# Patient Record
Sex: Male | Born: 1970 | Race: Black or African American | Hispanic: No | Marital: Single | Smoking: Former smoker
Health system: Southern US, Community
[De-identification: ages and names within clinical notes are randomized; demographics above are authoritative.]

## PROBLEM LIST (undated history)

## (undated) HISTORY — PX: TONSILLECTOMY: SUR1361

## (undated) HISTORY — PX: THORACIC FUSION: SHX1062

## (undated) HISTORY — PX: SPINAL FUSION: SHX223

---

## 2007-12-16 ENCOUNTER — Ambulatory Visit: Payer: Self-pay | Admitting: Internal Medicine

## 2008-08-28 ENCOUNTER — Ambulatory Visit: Payer: Self-pay | Admitting: Internal Medicine

## 2008-09-01 ENCOUNTER — Ambulatory Visit: Payer: Self-pay | Admitting: Family Medicine

## 2009-04-20 ENCOUNTER — Ambulatory Visit: Payer: Self-pay | Admitting: Family Medicine

## 2009-06-13 ENCOUNTER — Emergency Department: Payer: Self-pay | Admitting: Emergency Medicine

## 2010-07-24 ENCOUNTER — Ambulatory Visit: Payer: Self-pay | Admitting: Internal Medicine

## 2013-04-22 ENCOUNTER — Encounter: Payer: Self-pay | Admitting: Gastroenterology

## 2013-05-27 ENCOUNTER — Encounter: Payer: Self-pay | Admitting: Gastroenterology

## 2013-05-27 ENCOUNTER — Ambulatory Visit (INDEPENDENT_AMBULATORY_CARE_PROVIDER_SITE_OTHER): Payer: Medicare PPO | Admitting: Gastroenterology

## 2013-05-27 VITALS — BP 122/78 | HR 88 | Ht 71.0 in | Wt 203.4 lb

## 2013-05-27 DIAGNOSIS — K625 Hemorrhage of anus and rectum: Secondary | ICD-10-CM

## 2013-05-27 MED ORDER — NA SULFATE-K SULFATE-MG SULF 17.5-3.13-1.6 GM/177ML PO SOLN: 1.0000 | Freq: Once | ORAL | Status: DC

## 2013-05-27 NOTE — Patient Instructions (Addendum)

## 2013-05-27 NOTE — Progress Notes (Signed)
History of Present Illness: Pleasant 42 year old Afro-American male referred for evaluation of rectal bleeding. Approximately 5 weeks ago he developed constipation. He normally has a bowel movement at least twice a day.  He went 3 days without a BM and was complaining of lower abdominal discomfort. Bowels finally moved spontaneously. Several days later he was placed on antibiotics for a sinus infection. Stools normalized. He susequently developed bright red blood per rectum consisting of blood mixed with his stools. This occurred over several days. In the past he's had minimal blood in the total tissue which he has attributed to hemorrhoids. He's had no further episodes of abdominal pain or constipation.  In June, 2014 hemoglobin was 15 and white count 19.2    History reviewed. No pertinent past medical history. Past Surgical History  Procedure Laterality Date  . Spinal fusion     family history includes Colon polyps in his father; Diabetes in his maternal aunt; Heart disease in his father; Kidney disease in his father; and Prostate cancer in his paternal uncle. Current Outpatient Prescriptions  Medication Sig Dispense Refill  . Ascorbic Acid (VITAMIN C) 1000 MG tablet Take 1,000 mg by mouth daily.      . cholecalciferol (VITAMIN D) 400 UNITS TABS Take 400 Units by mouth daily.      . vitamin B-12 (CYANOCOBALAMIN) 1000 MCG tablet Take 1,000 mcg by mouth daily.      Marland Kitchen zinc gluconate 50 MG tablet Take 50 mg by mouth daily.       No current facility-administered medications for this visit.   Allergies as of 05/27/2013 - Review Complete 05/27/2013  Allergen Reaction Noted  . Erythromycin Nausea And Vomiting 05/27/2013    reports that he has been smoking.  He has never used smokeless tobacco. He reports that he does not drink alcohol or use illicit drugs.     Review of Systems: Pertinent positive and negative review of systems were noted in the above HPI section. All other review of systems  were otherwise negative.  Vital signs were reviewed in today's medical record Physical Exam: General: Well developed , well nourished, no acute distress Skin: anicteric Head: Normocephalic and atraumatic Eyes:  sclerae anicteric, EOMI Ears: Normal auditory acuity Mouth: No deformity or lesions Neck: Supple, no masses or thyromegaly Lungs: Clear throughout to auscultation Heart: Regular rate and rhythm; no murmurs, rubs or bruits Abdomen: Soft, non tender and non distended. No masses, hepatosplenomegaly or hernias noted. Normal Bowel sounds Rectal:deferred Musculoskeletal: Symmetrical with no gross deformities  Skin: No lesions on visible extremities Pulses:  Normal pulses noted Extremities: No clubbing, cyanosis, edema or deformities noted Neurological: Alert oriented x 4, grossly nonfocal Cervical Nodes:  No significant cervical adenopathy Inguinal Nodes: No significant inguinal adenopathy Psychological:  Alert and cooperative. Normal mood and affect

## 2013-05-27 NOTE — Assessment & Plan Note (Signed)
Patient had several days of limited rectal bleeding not characteristic of his hemorrhoidal bleeding, and recent constipation. While hemorrhoidal bleeding certainly is a possibility, a more proximal colonic bleeding source should be ruled out.  Medications #1 colonoscopy

## 2013-06-01 ENCOUNTER — Encounter: Payer: Self-pay | Admitting: Gastroenterology

## 2013-07-03 ENCOUNTER — Encounter: Payer: Self-pay | Admitting: Gastroenterology

## 2013-07-03 ENCOUNTER — Ambulatory Visit (AMBULATORY_SURGERY_CENTER): Payer: Medicare PPO | Admitting: Gastroenterology

## 2013-07-03 VITALS — BP 161/104 | HR 72 | Temp 98.1°F | Resp 17 | Ht 71.0 in | Wt 203.0 lb

## 2013-07-03 DIAGNOSIS — K625 Hemorrhage of anus and rectum: Secondary | ICD-10-CM

## 2013-07-03 DIAGNOSIS — D126 Benign neoplasm of colon, unspecified: Secondary | ICD-10-CM

## 2013-07-03 DIAGNOSIS — K5289 Other specified noninfective gastroenteritis and colitis: Secondary | ICD-10-CM

## 2013-07-03 DIAGNOSIS — K5730 Diverticulosis of large intestine without perforation or abscess without bleeding: Secondary | ICD-10-CM

## 2013-07-03 MED ORDER — SODIUM CHLORIDE 0.9 % IV SOLN: 500.0000 mL | INTRAVENOUS | Status: DC

## 2013-07-03 NOTE — Progress Notes (Signed)
Called to room to assist during endoscopic procedure.  Patient ID and intended procedure confirmed with present staff. Received instructions for my participation in the procedure from the performing physician.  

## 2013-07-03 NOTE — Op Note (Addendum)
[facility] [address] [place], [address]   COLONOSCOPY PROCEDURE REPORT  PATIENT: Tony Gillespie, Tony Gillespie  MR#: [id number] BIRTHDATE: [birth year] , 42  yrs. old GENDER: Male ENDOSCOPIST: Louis Meckel, MD REFERRED BY: PROCEDURE DATE:  07/03/2013 PROCEDURE:   Colonoscopy with snare polypectomy ASA CLASS:   Class II INDICATIONS:Rectal Bleeding. MEDICATIONS: MAC sedation, administered by CRNA and Propofol (Diprivan) 550 mg IV  DESCRIPTION OF PROCEDURE:   After the risks benefits and alternatives of the procedure were thoroughly explained, informed consent was obtained.  A digital rectal exam revealed no abnormalities of the rectum.   The LB [id number]  endoscope was introduced through the anus and advanced to the cecum, which was identified by both the appendix and ileocecal valve. No adverse events experienced.   The quality of the prep was Suprep good  The instrument was then slowly withdrawn as the colon was fully examined.    COLON FINDINGS: A semi-pedunculated polyp with a friable surface was found in the proximal sigmoid colon.  A polypectomy was performed using snare cautery.  The resection was complete and the polyp tissue was completely retrieved.   Beginning in the proximal sigmoid extending approximately 10 cm was an area of edematous mucosa with submucosal hemorrhage in conjunction with multiple diverticula.  There was marked spasm and luminal narrowing.  The scope traversed this area with moderate resistance.   Beginning in the proximal sigmoid extending approximately 10 cm was an area of edematous mucosa with submucosal hemorrhage in conjunction with multiple diverticula.  There was marked spasm and luminal narrowing.  The scope traversed this area with moderate resistance. Internal hemorrhoids were found.   Moderate diverticulosis was noted in the descending colon, transverse colon, ascending colon, and at the cecum.  Retroflexed views  revealed no abnormalities. The time to cecum=7 minutes 43 seconds.  Withdrawal time=14 minutes 11 seconds.  The scope was withdrawn and the procedure completed. COMPLICATIONS: There were no complications.  ENDOSCOPIC IMPRESSION: 1.   colon polyp 2.  segmental colitis-probable resolving diverticulitis 3.  internal hemorrhoids - probable source for bleeding 4.  diverticulosis  RECOMMENDATIONS: If the polyp(s) removed today are proven to be adenomatous (pre-cancerous) polyps, you will need a repeat colonoscopy in 5 years.  Otherwise you should continue to follow colorectal cancer screening guidelines for "routine risk" patients with colonoscopy in 10 years.  You will receive a letter within 1-2 weeks with the results of your biopsy as well as final recommendations.  Please call my office if you have not received a letter after 3 weeks.   eSigned:  Louis Meckel, MD 07/03/2013 3:39 PM Revised: 07/03/2013 3:39 PM  cc:   PATIENT NAME:  Tony Gillespie, Tony Gillespie MR#: [id number]

## 2013-07-03 NOTE — Progress Notes (Signed)
Procedure ends, to recovery, report given and VSS. 

## 2013-07-03 NOTE — Patient Instructions (Addendum)
YOU HAD AN ENDOSCOPIC PROCEDURE TODAY AT THE [facility]: Refer to the procedure report that was given to you for any specific questions about what was found during the examination.  If the procedure report does not answer your questions, please call your gastroenterologist to clarify.  If you requested that your care partner not be given the details of your procedure findings, then the procedure report has been included in a sealed envelope for you to review at your convenience later.  YOU SHOULD EXPECT: Some feelings of bloating in the abdomen. Passage of more gas than usual.  Walking can help get rid of the air that was put into your GI tract during the procedure and reduce the bloating. If you had a lower endoscopy (such as a colonoscopy or flexible sigmoidoscopy) you may notice spotting of blood in your stool or on the toilet paper. If you underwent a bowel prep for your procedure, then you may not have a normal bowel movement for a few days.  DIET: Your first meal following the procedure should be a light meal and then it is ok to progress to your normal diet.  A half-sandwich or bowl of soup is an example of a good first meal.  Heavy or fried foods are harder to digest and may make you feel nauseous or bloated.  Likewise meals heavy in dairy and vegetables can cause extra gas to form and this can also increase the bloating.  Drink plenty of fluids but you should avoid alcoholic beverages for 24 hours.  ACTIVITY: Your care partner should take you home directly after the procedure.  You should plan to take it easy, moving slowly for the rest of the day.  You can resume normal activity the day after the procedure however you should NOT DRIVE or use heavy machinery for 24 hours (because of the sedation medicines used during the test).    SYMPTOMS TO REPORT IMMEDIATELY: A gastroenterologist can be reached at any hour.  During normal business hours, 8:30 AM to 5:00 PM Monday through Friday,  call [phone].  After hours and on weekends, please call the GI answering service at [phone] who will take a message and have the physician on call contact you.   Following lower endoscopy (colonoscopy or flexible sigmoidoscopy):  Excessive amounts of blood in the stool  Significant tenderness or worsening of abdominal pains  Swelling of the abdomen that is new, acute  Fever of 100F or higher  FOLLOW UP: If any biopsies were taken you will be contacted by phone or by letter within the next 1-3 weeks.  Call your gastroenterologist if you have not heard about the biopsies in 3 weeks.  Our staff will call the home number listed on your records the next business day following your procedure to check on you and address any questions or concerns that you may have at that time regarding the information given to you following your procedure. This is a courtesy call and so if there is no answer at the home number and we have not heard from you through the emergency physician on call, we will assume that you have returned to your regular daily activities without incident.  SIGNATURES/CONFIDENTIALITY: You and/or your care partner have signed paperwork which will be entered into your electronic medical record.  These signatures attest to the fact that that the information above on your After Visit Summary has been reviewed and is understood.  Full responsibility of the confidentiality of this   discharge information lies with you and/or your care-partner.  Recommendations See procedure report  

## 2013-07-03 NOTE — Progress Notes (Signed)
Patient did not have preoperative order for IV antibiotic SSI prophylaxis. (G8918)  Patient did not experience any of the following events: a burn prior to discharge; a fall within the facility; wrong site/side/patient/procedure/implant event; or a hospital transfer or hospital admission upon discharge from the facility. (G8907)  

## 2013-07-06 ENCOUNTER — Telehealth: Payer: Self-pay | Admitting: *Deleted

## 2013-07-06 ENCOUNTER — Telehealth: Payer: Self-pay | Admitting: Gastroenterology

## 2013-07-06 NOTE — Telephone Encounter (Signed)
  Follow up Call-  Call back number 07/03/2013  Post procedure Call Back phone  # [phone]  Permission to leave phone message Yes    [facility]

## 2013-07-06 NOTE — Telephone Encounter (Signed)
Pt states he is having some discomfort to his LLQ, above his groin- rating as a "1.5"  Eating and having bowel movements.  He states this discomfort has gotten better over the weekend.  He states his abd is soft and palpable.  I told him to call back if pain increases- possibly could be from applying abdominal pressure.  Understanding voiced

## 2013-07-06 NOTE — Telephone Encounter (Signed)
LMOM to call back

## 2013-07-08 ENCOUNTER — Encounter: Payer: Self-pay | Admitting: Gastroenterology

## 2015-09-06 ENCOUNTER — Ambulatory Visit (INDEPENDENT_AMBULATORY_CARE_PROVIDER_SITE_OTHER): Payer: BLUE CROSS/BLUE SHIELD

## 2015-09-06 ENCOUNTER — Ambulatory Visit
Admission: EM | Admit: 2015-09-06 | Discharge: 2015-09-06 | Disposition: A | Discharge status: Home / Self Care | Payer: BLUE CROSS/BLUE SHIELD | Attending: Family Medicine | Admitting: Family Medicine

## 2015-09-06 DIAGNOSIS — J309 Allergic rhinitis, unspecified: Secondary | ICD-10-CM

## 2015-09-06 DIAGNOSIS — H6593 Unspecified nonsuppurative otitis media, bilateral: Secondary | ICD-10-CM | POA: Diagnosis not present

## 2015-09-06 DIAGNOSIS — J209 Acute bronchitis, unspecified: Secondary | ICD-10-CM

## 2015-09-06 MED ORDER — IPRATROPIUM-ALBUTEROL 0.5-2.5 (3) MG/3ML IN SOLN
3.0000 mL | Freq: Once | RESPIRATORY_TRACT | Status: AC
  Administered 2015-09-06: 3 mL via RESPIRATORY_TRACT

## 2015-09-06 MED ORDER — BENZONATATE 200 MG PO CAPS: 200.0000 mg | ORAL_CAPSULE | Freq: Three times a day (TID) | ORAL | Status: DC | PRN

## 2015-09-06 MED ORDER — AZITHROMYCIN 250 MG PO TABS: 250.0000 mg | ORAL_TABLET | Freq: Every day | ORAL | Status: DC

## 2015-09-06 MED ORDER — SALINE SPRAY 0.65 % NA SOLN: 2.0000 | NASAL | Status: DC

## 2015-09-06 MED ORDER — PREDNISONE 50 MG PO TABS
ORAL_TABLET | ORAL | Status: AC
Start: 2015-09-06 — End: 2015-09-11

## 2015-09-06 MED ORDER — FLUTICASONE PROPIONATE 50 MCG/ACT NA SUSP: 1.0000 | Freq: Every day | NASAL | Status: DC

## 2015-09-06 NOTE — ED Provider Notes (Signed)
CSN: [id number]     Arrival date & time 09/06/15  1259 History   First MD Initiated Contact with Patient 09/06/15 1346     Chief Complaint  Patient presents with  . URI   (Consider location/radiation/quality/duration/timing/severity/associated sxs/prior Treatment) HPI Comments: Married caucasian male here for evaluation of cough x 4 weeks rare clear sputum.  Smoker on/off for 40 years.  Has quit and restarted multiple times.  Currently 1 pack cigarettes per week.  Worried about air quality at work old building in [place] allergies are flaring down.  Having sweats at night and cough waking him up.  Takes claritin 10mg  po prn if congestion/post nasal drip bad  Sometimes gagging with current cough.  Has taken azithromycin without side effects in the past not allergic to it only erythromycin.  The history is provided by the patient.    History reviewed. No pertinent past medical history. Past Surgical History  Procedure Laterality Date  . Spinal fusion    . Tonsillectomy    . Thoracic fusion     Family History  Problem Relation Age of Onset  . Colon polyps Father   . Heart disease Father   . Kidney disease Father   . Diabetes Maternal Aunt   . Prostate cancer Paternal Uncle    Social History  Substance Use Topics  . Smoking status: Current Every Day Smoker -- 0.50 packs/day  . Smokeless tobacco: Never Used  . Alcohol Use: No    Review of Systems  Constitutional: Positive for diaphoresis. Negative for fever, chills, activity change, appetite change, fatigue and unexpected weight change.  HENT: Positive for congestion and postnasal drip. Negative for dental problem, drooling, ear discharge, ear pain, facial swelling, hearing loss, mouth sores, nosebleeds, rhinorrhea, sinus pressure, sneezing, sore throat, tinnitus, trouble swallowing and voice change.   Eyes: Negative for photophobia, pain, discharge, redness, itching and visual disturbance.  Respiratory: Positive for cough.  Negative for choking, chest tightness, shortness of breath, wheezing and stridor.   Cardiovascular: Negative for chest pain, palpitations and leg swelling.  Gastrointestinal: Negative for nausea, vomiting, abdominal pain, diarrhea, constipation, blood in stool and abdominal distention.  Endocrine: Negative for cold intolerance and heat intolerance.  Genitourinary: Negative for dysuria.  Musculoskeletal: Negative for myalgias, back pain, joint swelling, arthralgias, gait problem, neck pain and neck stiffness.  Skin: Negative for color change, pallor, rash and wound.  Allergic/Immunologic: Positive for environmental allergies. Negative for food allergies and immunocompromised state.  Neurological: Negative for dizziness, tremors, seizures, syncope, facial asymmetry, speech difficulty, weakness, light-headedness, numbness and headaches.  Hematological: Negative for adenopathy. Does not bruise/bleed easily.  Psychiatric/Behavioral: Negative for behavioral problems, confusion, sleep disturbance and agitation.    Allergies  Erythromycin  Home Medications   Prior to Admission medications   Medication Sig Start Date End Date Taking? Authorizing Provider  Ascorbic Acid (VITAMIN C) 1000 MG tablet Take 1,000 mg by mouth daily.    Historical Provider, MD  azithromycin (ZITHROMAX) 250 MG tablet Take 1 tablet (250 mg total) by mouth daily. Take first 2 tablets together, then 1 every day until finished. 09/06/15   Olen Cordial, NP  benzonatate (TESSALON) 200 MG capsule Take 1 capsule (200 mg total) by mouth 3 (three) times daily as needed. 09/06/15   Olen Cordial, NP  cholecalciferol (VITAMIN D) 400 UNITS TABS Take 400 Units by mouth daily.    Historical Provider, MD  fluticasone (FLONASE) 50 MCG/ACT nasal spray Place 1 spray into both nostrils daily. 09/06/15  Olen Cordial, NP  predniSONE (DELTASONE) 50 MG tablet Take 1 tab by mouth with breakfast daily x 5 days 09/06/15 09/11/15  Olen Cordial, NP  sodium chloride (OCEAN) 0.65 % SOLN nasal spray Place 2 sprays into both nostrils every 2 (two) hours while awake. 09/06/15   Olen Cordial, NP  vitamin B-12 (CYANOCOBALAMIN) 1000 MCG tablet Take 1,000 mcg by mouth daily.    Historical Provider, MD  zinc gluconate 50 MG tablet Take 50 mg by mouth daily.    Historical Provider, MD   Meds Ordered and Administered this Visit   Medications  ipratropium-albuterol (DUONEB) 0.5-2.5 (3) MG/3ML nebulizer solution 3 mL (not administered)    BP 126/86 mmHg  Pulse 92  Temp(Src) 97.4 F (36.3 C) (Tympanic)  Resp 17  Ht 5\' 11"  (1.803 m)  Wt 225 lb (102.059 kg)  BMI 31.39 kg/m2  SpO2 98% No data found.   Physical Exam  Constitutional: He is oriented to person, place, and time. He appears well-developed and well-nourished. He is active and cooperative.  Non-toxic appearance. He does not have a sickly appearance. He appears ill. No distress.  HENT:  Head: Normocephalic and atraumatic.  Right Ear: Hearing, external ear and ear canal normal. A middle ear effusion is present.  Left Ear: Hearing, external ear and ear canal normal. A middle ear effusion is present.  Nose: Mucosal edema and rhinorrhea present. No nose lacerations, sinus tenderness, nasal deformity, septal deviation or nasal septal hematoma. No epistaxis.  No foreign bodies. Right sinus exhibits no maxillary sinus tenderness and no frontal sinus tenderness. Left sinus exhibits no maxillary sinus tenderness and no frontal sinus tenderness.  Mouth/Throat: Uvula is midline and mucous membranes are normal. Mucous membranes are not pale, not dry and not cyanotic. He does not have dentures. No oral lesions. No trismus in the jaw. Normal dentition. No dental abscesses, uvula swelling, lacerations or dental caries. Posterior oropharyngeal edema and posterior oropharyngeal erythema present. No oropharyngeal exudate or tonsillar abscesses.  Eyes: Conjunctivae, EOM and lids are  normal. Pupils are equal, round, and reactive to light. Right eye exhibits no chemosis, no discharge, no exudate and no hordeolum. No foreign body present in the right eye. Left eye exhibits no chemosis, no discharge, no exudate and no hordeolum. No foreign body present in the left eye. Right conjunctiva is not injected. Right conjunctiva has no hemorrhage. Left conjunctiva is not injected. Left conjunctiva has no hemorrhage. No scleral icterus. Right eye exhibits normal extraocular motion and no nystagmus. Left eye exhibits normal extraocular motion and no nystagmus. Right pupil is round and reactive. Left pupil is round and reactive. Pupils are equal.  Neck: Trachea normal and normal range of motion. Neck supple. No tracheal tenderness, no spinous process tenderness and no muscular tenderness present. No rigidity. No tracheal deviation, no edema, no erythema and normal range of motion present.  Cardiovascular: Normal rate, regular rhythm, S1 normal, S2 normal, normal heart sounds and intact distal pulses.  PMI is not displaced.  Exam reveals no gallop and no friction rub.   No murmur heard. Pulmonary/Chest: Effort normal and breath sounds normal. No accessory muscle usage or stridor. No respiratory distress. He has no decreased breath sounds. He has no wheezes. He has no rhonchi. He has no rales. He exhibits no tenderness.  Raspy occasional nonproductive cough  During exam  Abdominal: Soft. He exhibits no distension. There is no rigidity and no guarding.  Musculoskeletal: Normal range of motion. He exhibits no  edema or tenderness.       Right shoulder: Normal.       Left shoulder: Normal.       Right elbow: Normal.      Left elbow: Normal.       Right hip: Normal.       Left hip: Normal.       Right knee: Normal.       Left knee: Normal.       Cervical back: Normal.       Thoracic back: Normal.       Lumbar back: Normal.       Right hand: Normal.       Left hand: Normal.  Lymphadenopathy:        Head (right side): No submental, no submandibular, no tonsillar, no preauricular, no posterior auricular and no occipital adenopathy present.       Head (left side): No submental, no submandibular, no tonsillar, no preauricular, no posterior auricular and no occipital adenopathy present.    He has no cervical adenopathy.       Right cervical: No superficial cervical, no deep cervical and no posterior cervical adenopathy present.      Left cervical: No superficial cervical, no deep cervical and no posterior cervical adenopathy present.       Right: No supraclavicular adenopathy present.       Left: No supraclavicular adenopathy present.  Neurological: He is alert and oriented to person, place, and time. He has normal strength. He displays no atrophy and no tremor. No cranial nerve deficit or sensory deficit. He exhibits normal muscle tone. He displays no seizure activity. Coordination and gait normal. GCS eye subscore is 4. GCS verbal subscore is 5. GCS motor subscore is 6.  Skin: Skin is warm, dry and intact. No abrasion, no bruising, no burn, no ecchymosis, no laceration, no petechiae and no rash noted. He is not diaphoretic. No cyanosis or erythema. No pallor. Nails show no clubbing.  Psychiatric: He has a normal mood and affect. His speech is normal and behavior is normal. Judgment and thought content normal. Cognition and memory are normal.  Nursing note and vitals reviewed.   ED Course  Procedures (including critical care time)  Labs Review Labs Reviewed - No data to display  Imaging Review Dg Chest 2 View  09/06/2015  CLINICAL DATA:  44 year old current smoker with 4 week history of nonproductive cough and 1 day history of low grade fever. EXAM: CHEST  2 VIEW COMPARISON:  None. FINDINGS: Cardiomediastinal silhouette unremarkable. Mild central peribronchial thickening. Lungs otherwise clear. No localized airspace consolidation. No pleural effusions. No pneumothorax. Normal pulmonary  vascularity. Prior posterior interspinous fusion with cerclage wires at T12-L1. 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 IMPRESSION: Mild changes of bronchitis and/or asthma which may be acute or chronic. No acute cardiopulmonary disease otherwise. Electronically Signed   By: Evangeline Dakin M.D.   On: 09/06/2015 13:53   1434 re-evaluated after duoneb by RN Marni Griffon administered. sp02 98% on room air post duoneb.  Patient did not get any relief from symptoms or feel like he had improved airflow in chest.  Discussed chest xray results positive for bronchitis possible asthma.  Patient to follow up with [facility] consider pulmonology referral due to history of allergies and smoking.  Start flonase and tessalon today.  Prednisone 50mg  po with breakfast x 5 days starting tomorrow Rx given.  Rx for azithromycin 2  tabs today and one tab daily days 2-5 if no improvement with flonase/prednisone/saline/tessalon pearles.  Patient did not want albuterol inhaler Rx.  Patient verbalized understanding of information/instructions, agreed with plan of care and had no further questions at this time.  MDM   1. Acute bronchitis, unspecified organism   2. Allergic rhinitis, unspecified allergic rhinitis type   3. Otitis media with effusion, bilateral    Work excuse given for today.  Prednisone 50mg  po daily x 5 days and tessalon pearles 200mg  po TID prn.  Honey with lemon, steam showers twice a day.  Post nasal drip probable cause from allergies and/or viral URI prominent in community at this time started bronchitis.  Patient given copy of radiology report.  If no improvement with night time cough honey/lemon, tessalon and prednisone will consider Rx for tussionex/cheratussin.  Discussed with patient I am working Fri-Mon and to contact me via telephone if no improvement in symptoms with plan of care to add narcotic cough medicine.   Bronchitis simple, community acquired, may have started as viral (probably respiratory syncytial, parainfluenza, influenza, or adenovirus), but now evidence of acute purulent bronchitis with resultant bronchial edema and mucus formation.  Differential Diagnoses:  Reactive Airway Disease (asthma, allergic aspergillosis eosinophilia), chronic bronchitis, respiratory infection (sinusitis, common cold, pneumonia), congestive heart failure, smoke/irritant exposure, reflux esophagitis, bronchogenic tumor, and/or aspiration syndromes.  I will give Zithromax for five days for possible Mycoplasma.  Without high fever, severe dyspnea and lack of physical findings or risk factors will hold on chest radiograph and CBC at this time.  I discussed that approximately 50% of patients with acute bronchitis have a cough that lasts up to three weeks, and 25% for over a month. Tylenol, one to two tablets every four hours as needed for fever or myalgias.   No aspirin. Patient instructed to follow up in one week or sooner if symptoms worsen. Patient verbalized agreement and understanding of treatment plan.  P2:  hand washing and cover cough  Continue claritin 10mg  po daily.  Add flonase 1 spray each nostril BID and nasal saline 2 sprays each nostril q2h prn congestion while awake.  Rx given.  Patient may use normal saline nasal spray as needed.  Consider antihistamine or nasal steroid use.  Avoid triggers if possible.  Shower prior to bedtime if exposed to triggers.  If allergic dust/dust mites recommend mattress/pillow covers/encasements; washing linens, vacuuming, sweeping, dusting weekly.  Call or return to clinic as needed if these symptoms worsen or fail to improve as anticipated.   Exitcare handout on allergic rhinitis given to patient.  Patient verbalized understanding of instructions, agreed with plan of care and had no further questions at this time.  P2:  Avoidance and hand washing.  Supportive treatment.   No evidence of  invasive bacterial infection, non toxic and well hydrated.  This is most likely self limiting viral infection.  I do not see where any further testing or imaging is necessary at this time.   I will suggest supportive care, rest, good hygiene and encourage the patient to take adequate fluids.  The patient is to return to clinic or EMERGENCY ROOM if symptoms worsen or change significantly e.g. ear pain, fever, purulent discharge from ears or bleeding.  Exitcare handout on otitis media with effusion given to patient.  Patient verbalized agreement and understanding of treatment plan.     Olen Cordial, NP 09/06/15 1456

## 2015-09-06 NOTE — ED Notes (Signed)
States has had a non productive cough x 3-4 weeks.

## 2015-09-06 NOTE — Discharge Instructions (Signed)
Acute Bronchitis Bronchitis is inflammation of the airways that extend from the windpipe into the lungs (bronchi). The inflammation often causes mucus to develop. This leads to a cough, which is the most common symptom of bronchitis.  In acute bronchitis, the condition usually develops suddenly and goes away over time, usually in a couple weeks. Smoking, allergies, and asthma can make bronchitis worse. Repeated episodes of bronchitis may cause further lung problems.  CAUSES Acute bronchitis is most often caused by the same virus that causes a cold. The virus can spread from person to person (contagious) through coughing, sneezing, and touching contaminated objects. SIGNS AND SYMPTOMS   Cough.   Fever.   Coughing up mucus.   Body aches.   Chest congestion.   Chills.   Shortness of breath.   Sore throat.  DIAGNOSIS  Acute bronchitis is usually diagnosed through a physical exam. Your health care provider will also ask you questions about your medical history. Tests, such as chest X-rays, are sometimes done to rule out other conditions.  TREATMENT  Acute bronchitis usually goes away in a couple weeks. Oftentimes, no medical treatment is necessary. Medicines are sometimes given for relief of fever or cough. Antibiotic medicines are usually not needed but may be prescribed in certain situations. In some cases, an inhaler may be recommended to help reduce shortness of breath and control the cough. A cool mist vaporizer may also be used to help thin bronchial secretions and make it easier to clear the chest.  HOME CARE INSTRUCTIONS  Get plenty of rest.   Drink enough fluids to keep your urine clear or pale yellow (unless you have a medical condition that requires fluid restriction). Increasing fluids may help thin your respiratory secretions (sputum) and reduce chest congestion, and it will prevent dehydration.   Take medicines only as directed by your health care provider.  If  you were prescribed an antibiotic medicine, finish it all even if you start to feel better.  Avoid smoking and secondhand smoke. Exposure to cigarette smoke or irritating chemicals will make bronchitis worse. If you are a smoker, consider using nicotine gum or skin patches to help control withdrawal symptoms. Quitting smoking will help your lungs heal faster.   Reduce the chances of another bout of acute bronchitis by washing your hands frequently, avoiding people with cold symptoms, and trying not to touch your hands to your mouth, nose, or eyes.   Keep all follow-up visits as directed by your health care provider.  SEEK MEDICAL CARE IF: Your symptoms do not improve after 1 week of treatment.  SEEK IMMEDIATE MEDICAL CARE IF:  You develop an increased fever or chills.   You have chest pain.   You have severe shortness of breath.  You have bloody sputum.   You develop dehydration.  You faint or repeatedly feel like you are going to pass out.  You develop repeated vomiting.  You develop a severe headache. MAKE SURE YOU:   Understand these instructions.  Will watch your condition.  Will get help right away if you are not doing well or get worse.   This information is not intended to replace advice given to you by your health care provider. Make sure you discuss any questions you have with your health care provider.   Document Released: 11/22/2004 Document Revised: 11/05/2014 Document Reviewed: 04/07/2013 Elsevier Interactive Patient Education 2016 [employer].  Allergic Rhinitis Allergic rhinitis is when the mucous membranes in the nose respond to allergens. Allergens are particles  in the air that cause your body to have an allergic reaction. This causes you to release allergic antibodies. Through a chain of events, these eventually cause you to release histamine into the blood stream. Although meant to protect the body, it is this release of histamine that causes your  discomfort, such as frequent sneezing, congestion, and an itchy, runny nose.  CAUSES Seasonal allergic rhinitis (hay fever) is caused by pollen allergens that may come from grasses, trees, and weeds. Year-round allergic rhinitis (perennial allergic rhinitis) is caused by allergens such as house dust mites, pet dander, and mold spores. SYMPTOMS  Nasal stuffiness (congestion).  Itchy, runny nose with sneezing and tearing of the eyes. DIAGNOSIS Your health care provider can help you determine the allergen or allergens that trigger your symptoms. If you and your health care provider are unable to determine the allergen, skin or blood testing may be used. Your health care provider will diagnose your condition after taking your health history and performing a physical exam. Your health care provider may assess you for other related conditions, such as asthma, pink eye, or an ear infection. TREATMENT Allergic rhinitis does not have a cure, but it can be controlled by:  Medicines that block allergy symptoms. These may include allergy shots, nasal sprays, and oral antihistamines.  Avoiding the allergen. Hay fever may often be treated with antihistamines in pill or nasal spray forms. Antihistamines block the effects of histamine. There are over-the-counter medicines that may help with nasal congestion and swelling around the eyes. Check with your health care provider before taking or giving this medicine. If avoiding the allergen or the medicine prescribed do not work, there are many new medicines your health care provider can prescribe. Stronger medicine may be used if initial measures are ineffective. Desensitizing injections can be used if medicine and avoidance does not work. Desensitization is when a patient is given ongoing shots until the body becomes less sensitive to the allergen. Make sure you follow up with your health care provider if problems continue. HOME CARE INSTRUCTIONS It is not possible  to completely avoid allergens, but you can reduce your symptoms by taking steps to limit your exposure to them. It helps to know exactly what you are allergic to so that you can avoid your specific triggers. SEEK MEDICAL CARE IF:  You have a fever.  You develop a cough that does not stop easily (persistent).  You have shortness of breath.  You start wheezing.  Symptoms interfere with normal daily activities.   This information is not intended to replace advice given to you by your health care provider. Make sure you discuss any questions you have with your health care provider.   Document Released: 07/10/2001 Document Revised: 11/05/2014 Document Reviewed: 06/22/2013 Elsevier Interactive Patient Education 2016 [employer]. Otitis Media With Effusion Otitis media with effusion is the presence of fluid in the middle ear. This is a common problem in children, which often follows ear infections. It may be present for weeks or longer after the infection. Unlike an acute ear infection, otitis media with effusion refers only to fluid behind the ear drum and not infection. Children with repeated ear and sinus infections and allergy problems are the most likely to get otitis media with effusion. CAUSES  The most frequent cause of the fluid buildup is dysfunction of the eustachian tubes. These are the tubes that drain fluid in the ears to the back of the nose (nasopharynx). SYMPTOMS   The main symptom of  this condition is hearing loss. As a result, you or your child may:  Listen to the TV at a loud volume.  Not respond to questions.  Ask "what" often when spoken to.  Mistake or confuse one sound or word for another.  There may be a sensation of fullness or pressure but usually not pain. DIAGNOSIS   Your health care provider will diagnose this condition by examining you or your child's ears.  Your health care provider may test the pressure in you or your child's ear with a  tympanometer.  A hearing test may be conducted if the problem persists. TREATMENT   Treatment depends on the duration and the effects of the effusion.  Antibiotics, decongestants, nose drops, and cortisone-type drugs (tablets or nasal spray) may not be helpful.  Children with persistent ear effusions may have delayed language or behavioral problems. Children at risk for developmental delays in hearing, learning, and speech may require referral to a specialist earlier than children not at risk.  You or your child's health care provider may suggest a referral to an ear, nose, and throat surgeon for treatment. The following may help restore normal hearing:  Drainage of fluid.  Placement of ear tubes (tympanostomy tubes).  Removal of adenoids (adenoidectomy). HOME CARE INSTRUCTIONS   Avoid secondhand smoke.  Infants who are breastfed are less likely to have this condition.  Avoid feeding infants while they are lying flat.  Avoid known environmental allergens.  Avoid people who are sick. SEEK MEDICAL CARE IF:   Hearing is not better in 3 months.  Hearing is worse.  Ear pain.  Drainage from the ear.  Dizziness. MAKE SURE YOU:   Understand these instructions.  Will watch your condition.  Will get help right away if you are not doing well or get worse.   This information is not intended to replace advice given to you by your health care provider. Make sure you discuss any questions you have with your health care provider.   Document Released: 11/22/2004 Document Revised: 11/05/2014 Document Reviewed: 05/12/2013 Elsevier Interactive Patient Education [employer].

## 2015-12-27 ENCOUNTER — Encounter: Payer: Self-pay | Admitting: Emergency Medicine

## 2015-12-27 ENCOUNTER — Ambulatory Visit (INDEPENDENT_AMBULATORY_CARE_PROVIDER_SITE_OTHER): Payer: BLUE CROSS/BLUE SHIELD

## 2015-12-27 ENCOUNTER — Ambulatory Visit
Admission: EM | Admit: 2015-12-27 | Discharge: 2015-12-27 | Disposition: A | Discharge status: Home / Self Care | Payer: BLUE CROSS/BLUE SHIELD | Attending: Family Medicine | Admitting: Family Medicine

## 2015-12-27 DIAGNOSIS — L03011 Cellulitis of right finger: Secondary | ICD-10-CM

## 2015-12-27 MED ORDER — SULFAMETHOXAZOLE-TRIMETHOPRIM 800-160 MG PO TABS: 1.0000 | ORAL_TABLET | Freq: Two times a day (BID) | ORAL | Status: DC

## 2015-12-27 MED ORDER — TETANUS-DIPHTH-ACELL PERTUSSIS 5-2.5-18.5 LF-MCG/0.5 IM SUSP
0.5000 mL | Freq: Once | INTRAMUSCULAR | Status: AC
  Administered 2015-12-27: 0.5 mL via INTRAMUSCULAR

## 2015-12-27 MED ORDER — HYDROCODONE-ACETAMINOPHEN 5-325 MG PO TABS: 1.0000 | ORAL_TABLET | Freq: Four times a day (QID) | ORAL | Status: DC | PRN

## 2015-12-27 NOTE — ED Notes (Signed)
Patient c/o metal piece in his right second finger for over a week.  Patient denies fevers.  Patient reports swelling, redness, and tenderness in his right 2nd finger.

## 2015-12-27 NOTE — Discharge Instructions (Signed)
Paronychia °Paronychia is an infection of the skin that surrounds a nail. It usually affects the skin around a fingernail, but it may also occur near a toenail. It often causes pain and swelling around the nail. This condition may come on suddenly or develop over a longer period. In some cases, a collection of pus (abscess) can form near or under the nail. Usually, paronychia is not serious and it clears up with treatment. °CAUSES °This condition may be caused by bacteria or fungi. It is commonly caused by either Streptococcus or Staphylococcus bacteria. The bacteria or fungi often cause the infection by getting into the affected area through an opening in the skin, such as a cut or a hangnail. °RISK FACTORS °This condition is more likely to develop in: °· People who get their hands wet often, such as those who work as dishwashers, bartenders, or nurses. °· People who bite their fingernails or suck their thumbs. °· People who trim their nails too short. °· People who have hangnails or injured fingertips. °· People who get manicures. °· People who have diabetes. °SYMPTOMS °Symptoms of this condition include: °· Redness and swelling of the skin near the nail. °· Tenderness around the nail when you touch the area. °· Pus-filled bumps under the cuticle. The cuticle is the skin at the base or sides of the nail. °· Fluid or pus under the nail. °· Throbbing pain in the area. °DIAGNOSIS °This condition is usually diagnosed with a physical exam. In some cases, a sample of pus may be taken from an abscess to be tested in a lab. This can help to determine what type of bacteria or fungi is causing the condition. °TREATMENT °Treatment for this condition depends on the cause and severity of the condition. If the condition is mild, it may clear up on its own in a few days. Your health care provider may recommend soaking the affected area in warm water a few times a day. When treatment is needed, the options may  include: °· Antibiotic medicine, if the condition is caused by a bacterial infection. °· Antifungal medicine, if the condition is caused by a fungal infection. °· Incision and drainage, if an abscess is present. In this procedure, the health care provider will cut open the abscess so the pus can drain out. °HOME CARE INSTRUCTIONS °· Soak the affected area in warm water if directed to do so by your health care provider. You may be told to do this for 20 minutes, 2-3 times a day. Keep the area dry in between soakings. °· Take medicines only as directed by your health care provider. °· If you were prescribed an antibiotic medicine, finish all of it even if you start to feel better. °· Keep the affected area clean. °· Do not try to drain a fluid-filled bump yourself. °· If you will be washing dishes or performing other tasks that require your hands to get wet, wear rubber gloves. You should also wear gloves if your hands might come in contact with irritating substances, such as cleaners or chemicals. °· Follow your health care provider's instructions about: °¨ Wound care. °¨ Bandage (dressing) changes and removal. °SEEK MEDICAL CARE IF: °· Your symptoms get worse or do not improve with treatment. °· You have a fever or chills. °· You have redness spreading from the affected area. °· You have continued or increased fluid, blood, or pus coming from the affected area. °· Your finger or knuckle becomes swollen or is difficult to move. °  °  This information is not intended to replace advice given to you by your health care provider. Make sure you discuss any questions you have with your health care provider.   Document Released: 04/10/2001 Document Revised: 03/01/2015 Document Reviewed: 09/22/2014 Elsevier Interactive Patient Education 2016 [place].   Warm soaks at least twice daily for 20 minutes Apply over the counter topical antibiotic ointment bid Cover up if working in dusty/dirty environment

## 2015-12-27 NOTE — ED Provider Notes (Signed)
CSN: [id number]     Arrival date & time 12/27/15  1151 History   First MD Initiated Contact with Patient 12/27/15 1434     Chief Complaint  Patient presents with  . Foreign Body in Skin   (Consider location/radiation/quality/duration/timing/severity/associated sxs/prior Treatment) HPI Comments: 45 yo male with c/o right index finger skin bump, redness and pain near the edge of the nail for the past week.  Patient states he thinks he has a metal piece stuck underneath the nail. Denies any fevers or chills. Does not recall last tetanus.   The history is provided by the patient.    History reviewed. No pertinent past medical history. Past Surgical History  Procedure Laterality Date  . Spinal fusion    . Tonsillectomy    . Thoracic fusion     Family History  Problem Relation Age of Onset  . Colon polyps Father   . Heart disease Father   . Kidney disease Father   . Diabetes Maternal Aunt   . Prostate cancer Paternal Uncle    Social History  Substance Use Topics  . Smoking status: Current Every Day Smoker -- 0.50 packs/day    Types: Cigarettes  . Smokeless tobacco: Never Used  . Alcohol Use: No    Review of Systems  Allergies  Erythromycin  Home Medications   Prior to Admission medications   Medication Sig Start Date End Date Taking? Authorizing Provider  Ascorbic Acid (VITAMIN C) 1000 MG tablet Take 1,000 mg by mouth daily.    Historical Provider, MD  azithromycin (ZITHROMAX) 250 MG tablet Take 1 tablet (250 mg total) by mouth daily. Take first 2 tablets together, then 1 every day until finished. 09/06/15   Olen Cordial, NP  benzonatate (TESSALON) 200 MG capsule Take 1 capsule (200 mg total) by mouth 3 (three) times daily as needed. 09/06/15   Olen Cordial, NP  cholecalciferol (VITAMIN D) 400 UNITS TABS Take 400 Units by mouth daily.    Historical Provider, MD  fluticasone (FLONASE) 50 MCG/ACT nasal spray Place 1 spray into both nostrils daily. 09/06/15   Olen Cordial, NP  HYDROcodone-acetaminophen (NORCO/VICODIN) 5-325 MG tablet Take 1-2 tablets by mouth every 6 (six) hours as needed. 12/27/15   Norval Gable, MD  sodium chloride (OCEAN) 0.65 % SOLN nasal spray Place 2 sprays into both nostrils every 2 (two) hours while awake. 09/06/15   Olen Cordial, NP  sulfamethoxazole-trimethoprim (BACTRIM DS,SEPTRA DS) 800-160 MG tablet Take 1 tablet by mouth 2 (two) times daily. 12/27/15   Norval Gable, MD  vitamin B-12 (CYANOCOBALAMIN) 1000 MCG tablet Take 1,000 mcg by mouth daily.    Historical Provider, MD  zinc gluconate 50 MG tablet Take 50 mg by mouth daily.    Historical Provider, MD   Meds Ordered and Administered this Visit   Medications  Tdap (BOOSTRIX) injection 0.5 mL (0.5 mLs Intramuscular Given 12/27/15 1328)    BP 124/83 mmHg  Pulse 77  Temp(Src) 98 F (36.7 C) (Oral)  Resp 16  Ht 5' 11.5" (1.816 m)  Wt 215 lb (97.523 kg)  BMI 29.57 kg/m2  SpO2 98% No data found.   Physical Exam  Constitutional: He appears well-developed and well-nourished. No distress.  Musculoskeletal:       Right hand: He exhibits tenderness (over skin on the right index finger lateral edge adjacent to the nail). He exhibits normal range of motion, no bony tenderness, normal two-point discrimination, normal capillary refill, no deformity, no laceration and no  swelling. Normal sensation noted. Normal strength noted.       Hands: Skin: He is not diaphoretic.  Nursing note and vitals reviewed.   ED Course  Procedures (including critical care time)  Labs Review Labs Reviewed  CULTURE, ROUTINE-ABSCESS    Imaging Review Dg Finger Index Right  12/27/2015  CLINICAL DATA:  Injured working with metal equipment 1 week ago, some swelling EXAM: RIGHT INDEX FINGER 2+V COMPARISON:  None. FINDINGS: No acute fracture is seen. No opaque foreign body is noted. Joint spaces appear normal and alignment is normal. There is soft tissue prominence near the base of the nail  of the right second digit, too proximal for subungual hematoma. IMPRESSION: No fracture. No opaque foreign body. Soft tissue swelling over the base of the distal phalanx of the right second digit on the dorsal aspect of questionable significance. Electronically Signed   By: Ivar Drape M.D.   On: 12/27/2015 13:49     Visual Acuity Review  Right Eye Distance:   Left Eye Distance:   Bilateral Distance:    Right Eye Near:   Left Eye Near:    Bilateral Near:         MDM   1. Paronychia, right     Discharge Medication List as of 12/27/2015  3:02 PM    START taking these medications   Details  HYDROcodone-acetaminophen (NORCO/VICODIN) 5-325 MG tablet Take 1-2 tablets by mouth every 6 (six) hours as needed., Starting 12/27/2015, Until Discontinued, Print    sulfamethoxazole-trimethoprim (BACTRIM DS,SEPTRA DS) 800-160 MG tablet Take 1 tablet by mouth 2 (two) times daily., Starting 12/27/2015, Until Discontinued, Normal       1. x-ray results and diagnosis reviewed with patient; informed consent obtained for I&D of paronychia, area cleaned and surface incised with an #11 blade without any pain; purulent drainage expressed; sample collected for culture 2. rx as per orders above; reviewed possible side effects, interactions, risks and benefits  3. Recommend supportive treatment with warm compresses or soaks; otc analgesics prn  4. Patient given tetanus vaccine in clinic 5. Follow-up prn if symptoms worsen or don't improve  Norval Gable, MD 12/27/15 1537

## 2015-12-31 LAB — CULTURE, ROUTINE-ABSCESS

## 2016-01-05 ENCOUNTER — Telehealth: Payer: Self-pay | Admitting: *Deleted

## 2016-01-05 NOTE — ED Notes (Signed)
Called patient and patient reports that his wound is healing and improving. Directed patient to follow up with PCP or return to [place] if symptoms return.

## 2016-11-07 NOTE — Telephone Encounter (Signed)
error 

## 2017-08-13 ENCOUNTER — Ambulatory Visit
Admission: EM | Admit: 2017-08-13 | Discharge: 2017-08-13 | Disposition: A | Discharge status: Home / Self Care | Payer: BLUE CROSS/BLUE SHIELD | Attending: Family Medicine | Admitting: Family Medicine

## 2017-08-13 ENCOUNTER — Encounter: Payer: Self-pay | Admitting: *Deleted

## 2017-08-13 DIAGNOSIS — Z2089 Contact with and (suspected) exposure to other communicable diseases: Secondary | ICD-10-CM

## 2017-08-13 DIAGNOSIS — Z207 Contact with and (suspected) exposure to pediculosis, acariasis and other infestations: Secondary | ICD-10-CM

## 2017-08-13 MED ORDER — PERMETHRIN 5 % EX CREA: TOPICAL_CREAM | CUTANEOUS | 0 refills | Status: DC

## 2017-08-13 NOTE — ED Provider Notes (Signed)
[facility]    CSN: [id number] Arrival date & time: 08/13/17  1515     History   Chief Complaint Chief Complaint  Patient presents with  . Follow-up    HPI  46 year old male presents with concern for scabies.  Patient states that he was in [place] for the past several months. 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 He was recently notified of infestation at the place where he was staying. He is concerned about the development of scabies. He has no rash. Feels well. He states that he will be returning to this place seen and wants to avoid developing scabies. He would like to discuss this today. He has no other complaints or concerns at this time.   History reviewed. No pertinent past medical history.  There are no active problems to display for this patient.  Past Surgical History:  Procedure Laterality Date  . SPINAL FUSION    . THORACIC FUSION    . TONSILLECTOMY      Home Medications    Prior to Admission medications   Medication Sig Start Date End Date Taking? Authorizing Provider  permethrin (ELIMITE) 5 % cream Thoroughly massage cream from head to soles of feet; leave on for 8 to 14 hours before removing (shower or bath) 08/13/17   Coral Spikes, DO    Family History Family History  Problem Relation Age of Onset  . Colon polyps Father   . Heart disease Father   . Kidney disease Father   . Diabetes Maternal Aunt   . Prostate cancer Paternal Uncle     Social History Social History  Substance Use Topics  . Smoking status: Current Every Day Smoker    Packs/day: 0.50    Types: Cigarettes  . Smokeless tobacco: Never Used  . Alcohol use No     Allergies   Erythromycin   Review of Systems Review of Systems  Constitutional: Negative.   Skin: Negative for rash.       No itching.   Physical Exam Triage Vital Signs ED Triage Vitals  Enc Vitals Group     BP 08/13/17 1532 138/90     Pulse Rate 08/13/17 1532 68     Resp 08/13/17  1532 16     Temp 08/13/17 1532 98.1 F (36.7 C)     Temp Source 08/13/17 1532 Oral     SpO2 08/13/17 1532 99 %     Weight 08/13/17 1534 178 lb (80.7 kg)     Height 08/13/17 1534 6' (1.829 m)     Head Circumference --      Peak Flow --      Pain Score --      Pain Loc --      Pain Edu? --      Excl. in [place]? --    Updated Vital Signs BP 138/90 (BP Location: Left Arm)   Pulse 68   Temp 98.1 F (36.7 C) (Oral)   Resp 16   Ht 6' (1.829 m)   Wt 178 lb (80.7 kg)   SpO2 99%   BMI 24.14 kg/m  Physical Exam  Constitutional: He is oriented to person, place, and time. He appears well-developed. No distress.  HENT:  Head: Normocephalic and atraumatic.  Nose: Nose normal.  Eyes: Conjunctivae are normal. No scleral icterus.  Cardiovascular: Normal rate and regular rhythm.   No murmur heard. Pulmonary/Chest: Effort normal and breath sounds normal. No respiratory distress. He has no wheezes. He  has no rales.  Neurological: He is alert and oriented to person, place, and time.  Skin: Skin is warm. No rash noted. No erythema.  Vitals reviewed.  UC Treatments / Results  Labs (all labs ordered are listed, but only abnormal results are displayed) Labs Reviewed - No data to display  EKG  EKG Interpretation None       Radiology No results found.  Procedures Procedures (including critical care time)  Medications Ordered in UC Medications - No data to display  Initial Impression / Assessment and Plan / UC Course  I have reviewed the triage vital signs and the nursing notes.  Pertinent labs & imaging results that were available during my care of the patient were reviewed by me and considered in my medical decision making (see chart for details).     46 year old male presents with scabies exposure. Asymptomatic currently. We discussed watchful waiting versus treatment as you can be asymptomatic for several weeks before symptoms. Patient elected for treatment. Starting on  permethrin.  Final Clinical Impressions(s) / UC Diagnoses   Final diagnoses:  Scabies exposure   New Prescriptions New Prescriptions   PERMETHRIN (ELIMITE) 5 % CREAM    Thoroughly massage cream from head to soles of feet; leave on for 8 to 14 hours before removing (shower or bath)    Controlled Substance Prescriptions [place] Controlled Substance Registry consulted? Not Applicable   Coral Spikes, DO 08/13/17 1600

## 2017-08-13 NOTE — ED Triage Notes (Signed)
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 Was notified that scabies was prevalent there and that he should be checked. He is presently asymptomatic but concerned he may have scabies.

## 2018-08-25 ENCOUNTER — Encounter: Payer: Self-pay | Admitting: Gastroenterology

## 2018-12-14 ENCOUNTER — Other Ambulatory Visit: Payer: Self-pay

## 2018-12-14 ENCOUNTER — Emergency Department: Payer: Self-pay

## 2018-12-14 ENCOUNTER — Inpatient Hospital Stay
Admission: EM | Admit: 2018-12-14 | Discharge: 2018-12-27 | DRG: 668 | Disposition: A | Discharge status: Home / Self Care | Payer: Self-pay | Attending: Surgery | Admitting: Surgery

## 2018-12-14 DIAGNOSIS — E43 Unspecified severe protein-calorie malnutrition: Secondary | ICD-10-CM | POA: Diagnosis present

## 2018-12-14 DIAGNOSIS — N3289 Other specified disorders of bladder: Secondary | ICD-10-CM

## 2018-12-14 DIAGNOSIS — Z6830 Body mass index (BMI) 30.0-30.9, adult: Secondary | ICD-10-CM

## 2018-12-14 DIAGNOSIS — Z981 Arthrodesis status: Secondary | ICD-10-CM

## 2018-12-14 DIAGNOSIS — N321 Vesicointestinal fistula: Secondary | ICD-10-CM

## 2018-12-14 DIAGNOSIS — Z87891 Personal history of nicotine dependence: Secondary | ICD-10-CM

## 2018-12-14 DIAGNOSIS — R590 Localized enlarged lymph nodes: Secondary | ICD-10-CM | POA: Diagnosis present

## 2018-12-14 DIAGNOSIS — N3001 Acute cystitis with hematuria: Secondary | ICD-10-CM

## 2018-12-14 DIAGNOSIS — D649 Anemia, unspecified: Secondary | ICD-10-CM | POA: Diagnosis present

## 2018-12-14 DIAGNOSIS — R7881 Bacteremia: Secondary | ICD-10-CM | POA: Diagnosis present

## 2018-12-14 DIAGNOSIS — I81 Portal vein thrombosis: Secondary | ICD-10-CM | POA: Diagnosis present

## 2018-12-14 DIAGNOSIS — Z881 Allergy status to other antibiotic agents status: Secondary | ICD-10-CM

## 2018-12-14 DIAGNOSIS — R319 Hematuria, unspecified: Secondary | ICD-10-CM

## 2018-12-14 DIAGNOSIS — N3081 Other cystitis with hematuria: Principal | ICD-10-CM | POA: Diagnosis present

## 2018-12-14 DIAGNOSIS — E669 Obesity, unspecified: Secondary | ICD-10-CM | POA: Diagnosis present

## 2018-12-14 DIAGNOSIS — B9620 Unspecified Escherichia coli [E. coli] as the cause of diseases classified elsewhere: Secondary | ICD-10-CM | POA: Diagnosis present

## 2018-12-14 DIAGNOSIS — D72822 Plasmacytosis: Secondary | ICD-10-CM | POA: Diagnosis present

## 2018-12-14 DIAGNOSIS — Z0189 Encounter for other specified special examinations: Secondary | ICD-10-CM

## 2018-12-14 DIAGNOSIS — K5730 Diverticulosis of large intestine without perforation or abscess without bleeding: Secondary | ICD-10-CM | POA: Diagnosis present

## 2018-12-14 DIAGNOSIS — R000 Tachycardia, unspecified: Secondary | ICD-10-CM

## 2018-12-14 DIAGNOSIS — D763 Other histiocytosis syndromes: Secondary | ICD-10-CM | POA: Diagnosis present

## 2018-12-14 DIAGNOSIS — R591 Generalized enlarged lymph nodes: Secondary | ICD-10-CM

## 2018-12-14 DIAGNOSIS — K567 Ileus, unspecified: Secondary | ICD-10-CM | POA: Diagnosis present

## 2018-12-14 LAB — URINALYSIS, COMPLETE (UACMP) WITH MICROSCOPIC
Glucose, UA: NEGATIVE mg/dL
KETONES UR: NEGATIVE mg/dL
Nitrite: NEGATIVE
PH: 7 (ref 5.0–8.0)
Protein, ur: >=300 mg/dL — AB
Specific Gravity, Urine: 1.014 (ref 1.005–1.030)
Squamous Epithelial / LPF: NONE SEEN (ref 0–5)

## 2018-12-14 LAB — CBC WITH DIFFERENTIAL/PLATELET
Abs Immature Granulocytes: 0.22 10*3/uL — ABNORMAL HIGH (ref 0.00–0.07)
Basophils Absolute: 0 10*3/uL (ref 0.0–0.1)
Basophils Relative: 0 %
EOS ABS: 0 10*3/uL (ref 0.0–0.5)
Eosinophils Relative: 0 %
HEMATOCRIT: 24.1 % — AB (ref 39.0–52.0)
HEMOGLOBIN: 7.7 g/dL — AB (ref 13.0–17.0)
Immature Granulocytes: 1 %
LYMPHS ABS: 1.1 10*3/uL (ref 0.7–4.0)
LYMPHS PCT: 7 %
MCH: 28.8 pg (ref 26.0–34.0)
MCHC: 32 g/dL (ref 30.0–36.0)
MCV: 90.3 fL (ref 80.0–100.0)
MONO ABS: 0.7 10*3/uL (ref 0.1–1.0)
MONOS PCT: 4 %
Neutro Abs: 14.7 10*3/uL — ABNORMAL HIGH (ref 1.7–7.7)
Neutrophils Relative %: 88 %
Platelets: 322 10*3/uL (ref 150–400)
RBC: 2.67 MIL/uL — ABNORMAL LOW (ref 4.22–5.81)
RDW: 16.6 % — ABNORMAL HIGH (ref 11.5–15.5)
WBC: 16.8 10*3/uL — ABNORMAL HIGH (ref 4.0–10.5)
nRBC: 0 % (ref 0.0–0.2)

## 2018-12-14 LAB — HEPATIC FUNCTION PANEL
ALT: 49 U/L — ABNORMAL HIGH (ref 0–44)
AST: 52 U/L — ABNORMAL HIGH (ref 15–41)
Albumin: 2.2 g/dL — ABNORMAL LOW (ref 3.5–5.0)
Alkaline Phosphatase: 242 U/L — ABNORMAL HIGH (ref 38–126)
BILIRUBIN DIRECT: 0.5 mg/dL — AB (ref 0.0–0.2)
BILIRUBIN TOTAL: 1.7 mg/dL — AB (ref 0.3–1.2)
Indirect Bilirubin: 1.2 mg/dL — ABNORMAL HIGH (ref 0.3–0.9)
Total Protein: 6.8 g/dL (ref 6.5–8.1)

## 2018-12-14 LAB — BASIC METABOLIC PANEL
Anion gap: 6 (ref 5–15)
BUN: 10 mg/dL (ref 6–20)
CHLORIDE: 102 mmol/L (ref 98–111)
CO2: 24 mmol/L (ref 22–32)
Calcium: 8.2 mg/dL — ABNORMAL LOW (ref 8.9–10.3)
Creatinine, Ser: 0.97 mg/dL (ref 0.61–1.24)
GFR calc Af Amer: >60 mL/min (ref >60)
GFR calc non Af Amer: >60 mL/min (ref >60)
GLUCOSE: 110 mg/dL — AB (ref 70–99)
POTASSIUM: 4.3 mmol/L (ref 3.5–5.1)
SODIUM: 132 mmol/L — AB (ref 135–145)

## 2018-12-14 LAB — PREPARE RBC (CROSSMATCH)

## 2018-12-14 LAB — LACTIC ACID, PLASMA: Lactic Acid, Venous: 1.4 mmol/L (ref 0.5–1.9)

## 2018-12-14 LAB — ABO/RH: ABO/RH(D): O POS

## 2018-12-14 MED ORDER — SODIUM CHLORIDE 0.9 % IV SOLN
1.0000 g | Freq: Once | INTRAVENOUS | Status: AC
  Administered 2018-12-14: 1 g via INTRAVENOUS
  Filled 2018-12-14: qty 10

## 2018-12-14 MED ORDER — ACETAMINOPHEN 325 MG PO TABS
650.0000 mg | ORAL_TABLET | Freq: Four times a day (QID) | ORAL | Status: DC | PRN
  Administered 2018-12-14 – 2018-12-16 (×2): 650 mg via ORAL
  Filled 2018-12-14 (×2): qty 2

## 2018-12-14 MED ORDER — DOCUSATE SODIUM 100 MG PO CAPS
100.0000 mg | ORAL_CAPSULE | Freq: Two times a day (BID) | ORAL | Status: DC | PRN
  Administered 2018-12-15 – 2018-12-20 (×6): 100 mg via ORAL
  Filled 2018-12-14 (×7): qty 1

## 2018-12-14 MED ORDER — IOPAMIDOL (ISOVUE-300) INJECTION 61%
30.0000 mL | Freq: Once | INTRAVENOUS | Status: AC | PRN
  Administered 2018-12-14: 30 mL via ORAL

## 2018-12-14 MED ORDER — IOHEXOL 300 MG/ML  SOLN
100.0000 mL | Freq: Once | INTRAMUSCULAR | Status: AC | PRN
  Administered 2018-12-14: 100 mL via INTRAVENOUS

## 2018-12-14 MED ORDER — ENSURE ENLIVE PO LIQD: 237.0000 mL | Freq: Two times a day (BID) | ORAL | Status: DC

## 2018-12-14 MED ORDER — MORPHINE SULFATE (PF) 2 MG/ML IV SOLN
2.0000 mg | INTRAVENOUS | Status: DC | PRN
  Administered 2018-12-14 – 2018-12-16 (×10): 2 mg via INTRAVENOUS
  Filled 2018-12-14 (×10): qty 1

## 2018-12-14 MED ORDER — SODIUM CHLORIDE 0.9% IV SOLUTION
Freq: Once | INTRAVENOUS | Status: AC
  Administered 2018-12-14: 18:00:00 via INTRAVENOUS
  Filled 2018-12-14: qty 250

## 2018-12-14 NOTE — ED Provider Notes (Signed)
[facility] Emergency Department Provider Note  ____________________________________________  Time seen: Approximately 12:47 PM  I have reviewed the triage vital signs and the nursing notes.   HISTORY  Chief Complaint Urinary Tract Infection   HPI Tony Gillespie is a 47 y.o. male with no significant past medical history who presents for evaluation of dysuria.  Patient reports that he was in his usual state of health until 4 weeks ago.  He was doing volunteer work in [place] when he started having air through his urine stream.  After that he started having chills.  He then started having several daily episodes of diarrhea which he describes as watery brown stool.  Over the last 3 weeks he has noted intermittent episodes where urine looks like feces, brown, foul smelling. He has dysuria during these episodes. No fever. No abdominal pain or flank pain. He has also noted intermittent blood in his urine for the last 3 weeks.  No personal or family history of Crohn's disease, no prior abdominal surgeries.  Patient has family history of renal cancer but no family history of bladder cancer.  He does endorse 25 pound weight loss over the last month however he attributes that to disliking the food in the countries that he was visiting.  He also endorses night sweats.  Patient has not seen a physician in several years.  He denies any prior chronic illnesses.  PMH None  Past Surgical History:  Procedure Laterality Date  . SPINAL FUSION    . THORACIC FUSION    . TONSILLECTOMY      Prior to Admission medications   Medication Sig Start Date End Date Taking? Authorizing Provider  permethrin (ELIMITE) 5 % cream Thoroughly massage cream from head to soles of feet; leave on for 8 to 14 hours before removing (shower or bath) 08/13/17   Coral Spikes, DO    Allergies Erythromycin  Family History  Problem Relation Age of Onset  . Colon polyps Father   . Heart disease  Father   . Kidney disease Father   . Diabetes Maternal Aunt   . Prostate cancer Paternal Uncle     Social History Social History   Tobacco Use  . Smoking status: Current Every Day Smoker    Packs/day: 0.50    Types: Cigarettes  . Smokeless tobacco: Never Used  Substance Use Topics  . Alcohol use: No  . Drug use: No    Review of Systems  Constitutional: Negative for fever. + chills Eyes: Negative for visual changes. ENT: Negative for sore throat. Neck: No neck pain  Cardiovascular: Negative for chest pain. Respiratory: Negative for shortness of breath. Gastrointestinal: Negative for abdominal pain, vomiting. + diarrhea. Genitourinary: + dysuria and fecaluria Musculoskeletal: Negative for back pain. Skin: Negative for rash. Neurological: Negative for headaches, weakness or numbness. Psych: No SI or HI  ____________________________________________   PHYSICAL EXAM:  VITAL SIGNS: ED Triage Vitals  Enc Vitals Group     BP 12/14/18 0837 102/60     Pulse Rate 12/14/18 0837 96     Resp 12/14/18 0837 14     Temp 12/14/18 0837 99.7 F (37.6 C)     Temp Source 12/14/18 0837 Oral     SpO2 12/14/18 0837 100 %     Weight 12/14/18 0839 160 lb (72.6 kg)     Height --      Head Circumference --      Peak Flow --      Pain  Score 12/14/18 0839 2     Pain Loc --      Pain Edu? --      Excl. in [place]? --     Constitutional: Alert and oriented. Well appearing and in no apparent distress. HEENT:      Head: Normocephalic and atraumatic.         Eyes: Conjunctivae are normal. Sclera is non-icteric.       Mouth/Throat: Mucous membranes are moist.       Neck: Supple with no signs of meningismus. Cardiovascular: Regular rate and rhythm. No murmurs, gallops, or rubs. 2+ symmetrical distal pulses are present in all extremities. No JVD. Respiratory: Normal respiratory effort. Lungs are clear to auscultation bilaterally. No wheezes, crackles, or rhonchi.  Gastrointestinal: Soft, non  tender, and non distended with positive bowel sounds. No rebound or guarding. Genitourinary: No CVA tenderness. Bilateral testicles are descended with no tenderness to palpation, bilateral positive cremasteric reflexes are present, no swelling or erythema of the scrotum. No evidence of inguinal hernia. Musculoskeletal: Nontender with normal range of motion in all extremities. No edema, cyanosis, or erythema of extremities. Neurologic: Normal speech and language. Face is symmetric. Moving all extremities. No gross focal neurologic deficits are appreciated. Skin: Skin is warm, dry and intact. No rash noted. Psychiatric: Mood and affect are normal. Speech and behavior are normal.  ____________________________________________   LABS (all labs ordered are listed, but only abnormal results are displayed)  Labs Reviewed  CBC WITH DIFFERENTIAL/PLATELET - Abnormal; Notable for the following components:      Result Value   WBC 16.8 (*)    RBC 2.67 (*)    Hemoglobin 7.7 (*)    HCT 24.1 (*)    RDW 16.6 (*)    Neutro Abs 14.7 (*)    Abs Immature Granulocytes 0.22 (*)    All other components within normal limits  BASIC METABOLIC PANEL - Abnormal; Notable for the following components:   Sodium 132 (*)    Glucose, Bld 110 (*)    Calcium 8.2 (*)    All other components within normal limits  URINALYSIS, COMPLETE (UACMP) WITH MICROSCOPIC - Abnormal; Notable for the following components:   Color, Urine YELLOW (*)    APPearance TURBID (*)    Hgb urine dipstick SMALL (*)    Bilirubin Urine SMALL (*)    Protein, ur >=300 (*)    Leukocytes,Ua SMALL (*)    RBC / HPF >50 (*)    WBC, UA >50 (*)    Bacteria, UA MANY (*)    All other components within normal limits  HEPATIC FUNCTION PANEL - Abnormal; Notable for the following components:   Albumin 2.2 (*)    AST 52 (*)    ALT 49 (*)    Alkaline Phosphatase 242 (*)    Total Bilirubin 1.7 (*)    Bilirubin, Direct 0.5 (*)    Indirect Bilirubin 1.2 (*)     All other components within normal limits  URINE CULTURE  LACTIC ACID, PLASMA   ____________________________________________  EKG  none  ____________________________________________  RADIOLOGY  I have personally reviewed the images performed during this visit and I agree with the Radiologist's read.   Interpretation by Radiologist:  Ct Abdomen Pelvis W Contrast  Result Date: 12/14/2018 CLINICAL DATA:  Fecal matter in urine for 3 days. Evaluate for fistula. EXAM: CT ABDOMEN AND PELVIS WITH CONTRAST TECHNIQUE: Multidetector CT imaging of the abdomen and pelvis was performed using the standard protocol following bolus administration of  intravenous contrast. CONTRAST:  143mL OMNIPAQUE IOHEXOL 300 MG/ML SOLN, 84mL ISOVUE-300 IOPAMIDOL (ISOVUE-300) INJECTION 61% COMPARISON:  None. FINDINGS: Lower chest: Mild dependent atelectasis and tiny pleural effusions. The lower chest is otherwise normal. Hepatobiliary: There is branching low-attenuation in the liver which appears to primarily represent thrombosed left portal vein, thrombosed anterior branch of the right portal vein, and thrombosis of the more peripheral posterior branch of the portal vein. The main portal vein and the extra hepatic portions of the portal vein are well opacified. It would be difficult to exclude some associated intraductal dilatation. The gallbladder is not distended. No definitive mass in the porta hepatis. There is cavernous transformation in the anterior aspect of the porta hepatis. Pancreas: Unremarkable. No pancreatic ductal dilatation or surrounding inflammatory changes. Spleen: There is low-attenuation in the medial aspect of the spleen on series 2, image 19. Few other regions of low attenuation, subtle and small, are seen on image 18. The spleen measures 13.5 cm in cranial caudal dimension, mildly enlarged. Adrenals/Urinary Tract: The adrenal glands and kidneys are unremarkable. The ureters are normal. There is high  attenuation in the posterior bladder on image 85, likely tiny stones. There is air in the anterior bladder. There is a collection of soft tissue, debris, fluid, and air between the anterior left dome of the bladder and the sigmoid colon. This collection extends superiorly as seen on image 72 of series 2. The collection exerts mass effect on the top of the bladder. There also appears to be some soft tissue nodularity associated with the bladder in this region. This abnormality is well seen on sagittal image 62 and appears to measure 8.2 x 4.7 by 4.6 cm in cranial caudal, anterior-posterior, and transverse dimensions. Stomach/Bowel: The stomach and small bowel are normal. There is thickening in the wall of the sigmoid colon in addition to diverticuli. The remainder of the colon is unremarkable. The appendix is normal. Vascular/Lymphatic: There is atherosclerotic change in the nonaneurysmal aorta and iliac vessels. There is an enlarged portacaval vein measuring 2 cm on axial image 32. There is retroperitoneal adenopathy as well with a left para or aortic node measuring 2 cm on image 41 of series 2. No definite adenopathy within the pelvis. Reproductive: Prostate is unremarkable. Other: There is ascites in the abdomen and pelvis. There is increased attenuation in the subcutaneous and intra-abdominal fat consistent with volume overload. Musculoskeletal: No bony metastatic disease noted. IMPRESSION: 1. There is thrombosis of the left portal vein, the anterior branch of the right portal vein, and the more peripheral aspect of the right portal vein posterior branch. The extrahepatic portions of the portal vein are well opacified. It would be difficult to exclude mild intrahepatic ductal dilatation although no extrahepatic ductal dilatation is noted. An underlying cause for the portal vein thrombosis is not seen on this study. No definitive mass in the porta hepatis or pancreas identified. 2. There is a collection of air,  debris, and soft tissue thickening between the sigmoid colon and the bladder dome. This explains the patient's symptoms. However, it is unclear whether the abnormality arises from the bladder given the nodular thickening of the bladder wall or arises from the sigmoid colon. A bladder carcinoma invading adjacent colon should be considered. The linear extension of air in the collection along the expected course of the urachal remnant raises the possibility of an adenocarcinoma forming in the urachus involving the bladder and adjacent sigmoid colon. A fistulous connection between the colon and bladder due to colonic  diverticular disease or a colonic neoplasm is possible as well. 3. Adenopathy in the retroperitoneal region and porta hepatis could be reactive or metastatic. 4. Mild splenomegaly. The low-attenuation in the periphery of the spleen medially may represent infarct. 5. Ascites, likely due to the liver abnormalities. Findings called to Dr. Alfred Levins. Electronically Signed   By: Dorise Bullion III M.D   On: 12/14/2018 14:28      ____________________________________________   PROCEDURES  Procedure(s) performed: None Procedures Critical Care performed: yes  CRITICAL CARE Performed by: Rudene Re  ?  Total critical care time: 35 min  Critical care time was exclusive of separately billable procedures and treating other patients.  Critical care was necessary to treat or prevent imminent or life-threatening deterioration.  Critical care was time spent personally by me on the following activities: development of treatment plan with patient and/or surrogate as well as nursing, discussions with consultants, evaluation of patient's response to treatment, examination of patient, obtaining history from patient or surrogate, ordering and performing treatments and interventions, ordering and review of laboratory studies, ordering and review of radiographic studies, pulse oximetry and  re-evaluation of patient's condition.  ____________________________________________   INITIAL IMPRESSION / ASSESSMENT AND PLAN / ED COURSE  48 y.o. male with no significant past medical history who presents for evaluation of dysuria, questionable fecal matter in urine, weight loss, chills, night sweats.  Patient looks well appearing and in no distress, he has normal vital signs with a low temp of 99.7.  Abdomen is soft with no tenderness throughout.  Patient has several lab abnormalities including leukocytosis with white count of 16.8, anemia with hemoglobin of 7.7.  Mildly elevated LFTs. Normal lactic acid.  Normal kidney function.  UA positive for urinary tract infection.  Differential diagnoses including UTI versus bladder cancer versus colovesical fistula.  CT abdomen pelvis pending.  Patient given Rocephin for UTI.  Currently no signs of sepsis.  Patient with intermittent hematuria    _________________________ 2:47 PM on 12/14/2018 -----------------------------------------  CT consistent with bladder mass eroding into colon, also thrombosed portal vein with splenomegaly and ascites.  Discussed with Dr. Rogue Bussing from oncology recommend admission for expedited management.  He recommended against heparin since patient has no abdominal pain and therefore thrombosed portal vein is most likely not acute.  Is also concerned with patient having hematuria and drop in hemoglobin that heparin may exacerbate the bleeding and make patient unstable.  I spoke with Dr. Junious Silk from urology who will follow patient in house for further recommendations.  In the meantime we will get him admitted to the hospitalist service.  Patient has been updated on all findings.   As part of my medical decision making, I reviewed the following data within the [place] notes reviewed and incorporated, Labs reviewed , Old chart reviewed, Radiograph reviewed , Discussed with admitting physician ,  A consult was requested and obtained from this/these consultant(s) Urology and Oncology, Notes from prior ED visits and [place] Controlled Substance Database    Pertinent labs & imaging results that were available during my care of the patient were reviewed by me and considered in my medical decision making (see chart for details).    ____________________________________________   FINAL CLINICAL IMPRESSION(S) / ED DIAGNOSES  Final diagnoses:  Bladder mass  Colovesical fistula  Anemia, unspecified type  Acute cystitis with hematuria  Portal vein thrombosis      NEW MEDICATIONS STARTED DURING THIS VISIT:  ED Discharge Orders    None  Note:  This document was prepared using Dragon voice recognition software and may include unintentional dictation errors.    Alfred Levins, [place], MD 12/14/18 1452

## 2018-12-14 NOTE — ED Triage Notes (Signed)
Pt presents via POV c/o "fecal matter" in urine. Reports urinary symptoms beginning in January.

## 2018-12-14 NOTE — H&P (Signed)
[facility] [place] NAME: Tony Gillespie    MR#:  [id number]  DATE OF BIRTH:  20-Jan-1971  DATE OF ADMISSION:  12/14/2018  PRIMARY CARE PHYSICIAN: Patient, No Pcp Per   REQUESTING/REFERRING PHYSICIAN: Alfred Levins  CHIEF COMPLAINT:   Chief Complaint  Patient presents with  . Urinary Tract Infection    HISTORY OF PRESENT ILLNESS: Tony Gillespie  is a 48 y.o. male with a known history of no medical issues, did not go to a physician for last 2 years and does not take any prescription medicines at home.  For last 1 to 2 months had some stomach virus episodes, with diarrhea.  Since then he had been having on and off episodes of loose stools and stomach upset. For last 1 month he also started noticing dirty looking urine which lately converted having stool and gas coming through the urine.  He also had few episodes where he has blood coming through the urine. He denies any fever or abdominal pain.  Concerned with this he decided to come to emergency room today. CT scan showed a bladder mass with some involvement of sigmoid colon and possible fistula between the 2.  Also noted to have portal vein thrombosis. ER physician spoke to urologist and oncologist on call and they suggested to admit and not to start anticoagulation at this time but keep n.p.o. from midnight for possible procedures or biopsy tomorrow.  PAST MEDICAL HISTORY:  History reviewed. No pertinent past medical history.  PAST SURGICAL HISTORY:  Past Surgical History:  Procedure Laterality Date  . SPINAL FUSION    . THORACIC FUSION    . TONSILLECTOMY      SOCIAL HISTORY:  Social History   Tobacco Use  . Smoking status: Former Smoker    Packs/day: 0.50    Types: Cigarettes  . Smokeless tobacco: Never Used  Substance Use Topics  . Alcohol use: No    FAMILY HISTORY:  Family History  Problem Relation Age of Onset  . Colon polyps Father   . Heart disease Father   . Kidney disease  Father   . Diabetes Maternal Aunt   . Prostate cancer Paternal Uncle     DRUG ALLERGIES:  Allergies  Allergen Reactions  . Erythromycin Nausea And Vomiting    REVIEW OF SYSTEMS:   CONSTITUTIONAL: No fever, no fatigue or weakness.  EYES: No blurred or double vision.  EARS, NOSE, AND THROAT: No tinnitus or ear pain.  RESPIRATORY: No cough, shortness of breath, wheezing or hemoptysis.  CARDIOVASCULAR: No chest pain, orthopnea, edema.  GASTROINTESTINAL: No nausea, vomiting, diarrhea or abdominal pain.  GENITOURINARY: No dysuria, hematuria.  ENDOCRINE: No polyuria, nocturia,  HEMATOLOGY: No anemia, easy bruising or bleeding SKIN: No rash or lesion. MUSCULOSKELETAL: No joint pain or arthritis.   NEUROLOGIC: No tingling, numbness, weakness.  PSYCHIATRY: No anxiety or depression.   MEDICATIONS AT HOME:  Prior to Admission medications   Medication Sig Start Date End Date Taking? Authorizing Provider  permethrin (ELIMITE) 5 % cream Thoroughly massage cream from head to soles of feet; leave on for 8 to 14 hours before removing (shower or bath) 08/13/17   Cook, Michael Boston G, DO      PHYSICAL EXAMINATION:   VITAL SIGNS: Blood pressure 100/67, pulse 74, temperature 99.7 F (37.6 C), temperature source Oral, resp. rate 16, weight 72.6 kg, SpO2 99 %.  GENERAL:  48 y.o.-year-old patient lying in the bed with no acute distress.  EYES: Pupils equal,  round, reactive to light and accommodation. No scleral icterus. Extraocular muscles intact.  HEENT: Head atraumatic, normocephalic. Oropharynx and nasopharynx clear.  Pale. NECK:  Supple, no jugular venous distention. No thyroid enlargement, no tenderness.  LUNGS: Normal breath sounds bilaterally, no wheezing, rales,rhonchi or crepitation. No use of accessory muscles of respiration.  CARDIOVASCULAR: S1, S2 normal. No murmurs, rubs, or gallops.  ABDOMEN: Soft, nontender, nondistended. Bowel sounds present. No organomegaly or mass.  EXTREMITIES: No  pedal edema, cyanosis, or clubbing.  NEUROLOGIC: Cranial nerves II through XII are intact. Muscle strength 5/5 in all extremities. Sensation intact. Gait not checked.  PSYCHIATRIC: The patient is alert and oriented x 3.  SKIN: No obvious rash, lesion, or ulcer.   LABORATORY PANEL:   CBC Recent Labs  Lab 12/14/18 0953  WBC 16.8*  HGB 7.7*  HCT 24.1*  PLT 322  MCV 90.3  MCH 28.8  MCHC 32.0  RDW 16.6*  LYMPHSABS 1.1  MONOABS 0.7  EOSABS 0.0  BASOSABS 0.0   ------------------------------------------------------------------------------------------------------------------  Chemistries  Recent Labs  Lab 12/14/18 0953  NA 132*  K 4.3  CL 102  CO2 24  GLUCOSE 110*  BUN 10  CREATININE 0.97  CALCIUM 8.2*  AST 52*  ALT 49*  ALKPHOS 242*  BILITOT 1.7*   ------------------------------------------------------------------------------------------------------------------ CrCl cannot be calculated (Unknown ideal weight.). ------------------------------------------------------------------------------------------------------------------ No results for input(s): TSH, T4TOTAL, T3FREE, THYROIDAB in the last 72 hours.  Invalid input(s): FREET3   Coagulation profile No results for input(s): INR, PROTIME in the last 168 hours. ------------------------------------------------------------------------------------------------------------------- No results for input(s): DDIMER in the last 72 hours. -------------------------------------------------------------------------------------------------------------------  Cardiac Enzymes No results for input(s): CKMB, TROPONINI, MYOGLOBIN in the last 168 hours.  Invalid input(s): CK ------------------------------------------------------------------------------------------------------------------ Invalid input(s):  POCBNP  ---------------------------------------------------------------------------------------------------------------  Urinalysis    Component Value Date/Time   COLORURINE YELLOW (A) 12/14/2018 0953   APPEARANCEUR TURBID (A) 12/14/2018 0953   LABSPEC 1.014 12/14/2018 0953   PHURINE 7.0 12/14/2018 0953   GLUCOSEU NEGATIVE 12/14/2018 0953   HGBUR SMALL (A) 12/14/2018 0953   BILIRUBINUR SMALL (A) 12/14/2018 0953   KETONESUR NEGATIVE 12/14/2018 0953   PROTEINUR >=300 (A) 12/14/2018 0953   NITRITE NEGATIVE 12/14/2018 0953   LEUKOCYTESUR SMALL (A) 12/14/2018 0953     RADIOLOGY: Ct Abdomen Pelvis W Contrast  Result Date: 12/14/2018 CLINICAL DATA:  Fecal matter in urine for 3 days. Evaluate for fistula. EXAM: CT ABDOMEN AND PELVIS WITH CONTRAST TECHNIQUE: Multidetector CT imaging of the abdomen and pelvis was performed using the standard protocol following bolus administration of intravenous contrast. CONTRAST:  150mL OMNIPAQUE IOHEXOL 300 MG/ML SOLN, 2mL ISOVUE-300 IOPAMIDOL (ISOVUE-300) INJECTION 61% COMPARISON:  None. FINDINGS: Lower chest: Mild dependent atelectasis and tiny pleural effusions. The lower chest is otherwise normal. Hepatobiliary: There is branching low-attenuation in the liver which appears to primarily represent thrombosed left portal vein, thrombosed anterior branch of the right portal vein, and thrombosis of the more peripheral posterior branch of the portal vein. The main portal vein and the extra hepatic portions of the portal vein are well opacified. It would be difficult to exclude some associated intraductal dilatation. The gallbladder is not distended. No definitive mass in the porta hepatis. There is cavernous transformation in the anterior aspect of the porta hepatis. Pancreas: Unremarkable. No pancreatic ductal dilatation or surrounding inflammatory changes. Spleen: There is low-attenuation in the medial aspect of the spleen on series 2, image 19. Few other regions  of low attenuation, subtle and small, are seen on image 18. The spleen measures 13.5  cm in cranial caudal dimension, mildly enlarged. Adrenals/Urinary Tract: The adrenal glands and kidneys are unremarkable. The ureters are normal. There is high attenuation in the posterior bladder on image 85, likely tiny stones. There is air in the anterior bladder. There is a collection of soft tissue, debris, fluid, and air between the anterior left dome of the bladder and the sigmoid colon. This collection extends superiorly as seen on image 72 of series 2. The collection exerts mass effect on the top of the bladder. There also appears to be some soft tissue nodularity associated with the bladder in this region. This abnormality is well seen on sagittal image 62 and appears to measure 8.2 x 4.7 by 4.6 cm in cranial caudal, anterior-posterior, and transverse dimensions. Stomach/Bowel: The stomach and small bowel are normal. There is thickening in the wall of the sigmoid colon in addition to diverticuli. The remainder of the colon is unremarkable. The appendix is normal. Vascular/Lymphatic: There is atherosclerotic change in the nonaneurysmal aorta and iliac vessels. There is an enlarged portacaval vein measuring 2 cm on axial image 32. There is retroperitoneal adenopathy as well with a left para or aortic node measuring 2 cm on image 41 of series 2. No definite adenopathy within the pelvis. Reproductive: Prostate is unremarkable. Other: There is ascites in the abdomen and pelvis. There is increased attenuation in the subcutaneous and intra-abdominal fat consistent with volume overload. Musculoskeletal: No bony metastatic disease noted. IMPRESSION: 1. There is thrombosis of the left portal vein, the anterior branch of the right portal vein, and the more peripheral aspect of the right portal vein posterior branch. The extrahepatic portions of the portal vein are well opacified. It would be difficult to exclude mild intrahepatic  ductal dilatation although no extrahepatic ductal dilatation is noted. An underlying cause for the portal vein thrombosis is not seen on this study. No definitive mass in the porta hepatis or pancreas identified. 2. There is a collection of air, debris, and soft tissue thickening between the sigmoid colon and the bladder dome. This explains the patient's symptoms. However, it is unclear whether the abnormality arises from the bladder given the nodular thickening of the bladder wall or arises from the sigmoid colon. A bladder carcinoma invading adjacent colon should be considered. The linear extension of air in the collection along the expected course of the urachal remnant raises the possibility of an adenocarcinoma forming in the urachus involving the bladder and adjacent sigmoid colon. A fistulous connection between the colon and bladder due to colonic diverticular disease or a colonic neoplasm is possible as well. 3. Adenopathy in the retroperitoneal region and porta hepatis could be reactive or metastatic. 4. Mild splenomegaly. The low-attenuation in the periphery of the spleen medially may represent infarct. 5. Ascites, likely due to the liver abnormalities. Findings called to Dr. Alfred Levins. Electronically Signed   By: Dorise Bullion III M.D   On: 12/14/2018 14:28    EKG: No orders found for this or any previous visit.  IMPRESSION AND PLAN:  *Bladder mass Hematuria Colovesical fistula  Admit to medical services, consulted urologist and oncologist by ER. We will also get general surgery to help for possible colostomy. N.p.o. after midnight for possible procedure.  *Symptomatic anemia We will transfuse 1 unit PRBC, this is due to hematuria. Continue to monitor and avoid anticoagulants.  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  *UTI Rocephin 1 dose is given by ER,  I would let surgery decide  further antibiotics.  All the records are reviewed and case discussed with ED provider. Management plans discussed with the patient, family and they are in agreement.  CODE STATUS: Full code    TOTAL TIME TAKING CARE OF THIS PATIENT: 45 minutes.    Vaughan Basta M.D on 12/14/2018   Between 7am to 6pm - Pager - [phone]  After 6pm go to [email or web address] - password [email or web address] [facility]  Office  [phone]  CC: Primary care physician; Patient, No Pcp Per   Note: This dictation was prepared with Dragon dictation along with smaller phrase technology. Any transcriptional errors that result from this process are unintentional.

## 2018-12-14 NOTE — ED Notes (Signed)
Pt returned from [place] and [place] a week ago.

## 2018-12-14 NOTE — Plan of Care (Signed)
Pt is running a fever. Asked Dr. Anselm Jungling if he wanted [place] to hold off on the blood transfusion because of the temperature.  He said yes - Pt's Hgb is > 7 - 7.7.  CBC will be checked tomorrow am and determination will be made then.
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
## 2018-12-14 NOTE — Consult Note (Signed)
Subjective:   CC: Colovesicular fistula  HPI:  ADRIN Gillespie is a 48 y.o. male who was consulted by [facility] for issue above.  Symptoms were first noted a few weeks ago.   Initially hematuria and then pyuria with mixed pneumouria.  It is now uncomfortable for Tony Gillespie to void secondary to the persistent feculent discharge.  This all started initially when he was out of the country, he states that he has lost over 25 pounds.  He initially thought this was due to the dietary changes he was on while overseas.  He has gained roughly 10 pounds of that back, but still is roughly 15 pounds less compared to 2 months ago.  Due to the persistent nature of the urinary symptoms, he came to the emergency department.  He does endorse occasional diarrhea but overall not significant abdominal pain.  Work-up in the ED noted a potential mass arising from the bladder or colon causing a colovesicular fistula as well as anemia.  Surgery consulted for possible surgical management of this newly discovered colovesicular fistula.  Last colonoscopy was in 2014 noted to have tubular adenoma recommended to follow-up in 5 years.  He was also noted to have diverticulosis at that time.  Patient denies any history of episodic abdominal pain.    Past Medical History: None reported  Past Surgical History:  Past Surgical History:  Procedure Laterality Date  . SPINAL FUSION    . THORACIC FUSION    . TONSILLECTOMY      Family History: family history includes Colon polyps in his father; Diabetes in his maternal aunt; Heart disease in his father; Kidney disease in his father; Prostate cancer in his paternal uncle.  Social History:  reports that he has quit smoking. His smoking use included cigarettes. He smoked 0.50 packs per day. He has never used smokeless tobacco. He reports that he does not drink alcohol or use drugs.  Current Medications:  Medications Prior to Admission  Medication Sig Dispense Refill  . permethrin (ELIMITE)  5 % cream Thoroughly massage cream from head to soles of feet; leave on for 8 to 14 hours before removing (shower or bath) 60 g 0    Allergies:  Allergies as of 12/14/2018 - Review Complete 12/14/2018  Allergen Reaction Noted  . Erythromycin Nausea And Vomiting 05/27/2013    ROS:  General: Denies fatigue, fevers, chills, and night sweats. Eyes: Denies blurry vision, double vision, eye pain, itchy eyes, and tearing. Ears: Denies hearing loss, earache, and ringing in ears. Nose: Denies sinus pain, congestion, infections, runny nose, and nosebleeds. Mouth/throat: Denies hoarseness, sore throat, bleeding gums, and difficulty swallowing. Heart: Denies chest pain, palpitations, racing heart, irregular heartbeat, leg pain or swelling, and decreased activity tolerance. Respiratory: Denies breathing difficulty, shortness of breath, wheezing, cough, and sputum. GI: Denies change in appetite, heartburn, nausea, vomiting, constipation, , and blood in stool. GU: positive for difficulty urinating, pain with urinating, urgency, frequency, blood in urine. Musculoskeletal: Denies joint stiffness, pain, swelling, muscle weakness. Skin: Denies rash, itching, mass, tumors, sores, and boils Neurologic: Denies headache, fainting, dizziness, seizures, numbness, and tingling. Psychiatric: Denies depression, anxiety, difficulty sleeping, and memory loss. Endocrine: Denies heat or cold intolerance, and increased thirst or urination. Blood/lymph: Denies easy bruising, easy bruising, and swollen glands     Objective:     BP (!) 99/58 (BP Location: Left Arm)   Pulse 87   Temp 99.5 F (37.5 C) (Oral)   Resp 18   Ht 5\' 11"  (1.803 m)  Wt 70.7 kg   SpO2 97%   BMI 21.74 kg/m   Constitutional :  alert, cooperative, appears stated age and no distress  Lymphatics/Throat:  no asymmetry, masses, or scars  Respiratory:  clear to auscultation bilaterally  Cardiovascular:  regular rate and rhythm   Gastrointestinal: soft, non-tender; bowel sounds normal; no masses,  no organomegaly.   Musculoskeletal: Steady gait and movement  Skin: Cool and moist   Psychiatric: Normal affect, non-agitated, not confused       LABS:  CMP Latest Ref Rng & Units 12/14/2018  Glucose 70 - 99 mg/dL 110(H)  BUN 6 - 20 mg/dL 10  Creatinine 0.61 - 1.24 mg/dL 0.97  Sodium 135 - 145 mmol/L 132(L)  Potassium 3.5 - 5.1 mmol/L 4.3  Chloride 98 - 111 mmol/L 102  CO2 22 - 32 mmol/L 24  Calcium 8.9 - 10.3 mg/dL 8.2(L)  Total Protein 6.5 - 8.1 g/dL 6.8  Total Bilirubin 0.3 - 1.2 mg/dL 1.7(H)  Alkaline Phos 38 - 126 U/L 242(H)  AST 15 - 41 U/L 52(H)  ALT 0 - 44 U/L 49(H)   CBC Latest Ref Rng & Units 12/14/2018  WBC 4.0 - 10.5 K/uL 16.8(H)  Hemoglobin 13.0 - 17.0 g/dL 7.7(L)  Hematocrit 39.0 - 52.0 % 24.1(L)  Platelets 150 - 400 K/uL 322    RADS: CLINICAL DATA:  Fecal matter in urine for 3 days. Evaluate for fistula.  EXAM: CT ABDOMEN AND PELVIS WITH CONTRAST  TECHNIQUE: Multidetector CT imaging of the abdomen and pelvis was performed using the standard protocol following bolus administration of intravenous contrast.  CONTRAST:  167mL OMNIPAQUE IOHEXOL 300 MG/ML SOLN, 53mL ISOVUE-300 IOPAMIDOL (ISOVUE-300) INJECTION 61%  COMPARISON:  None.  FINDINGS: Lower chest: Mild dependent atelectasis and tiny pleural effusions. The lower chest is otherwise normal.  Hepatobiliary: There is branching low-attenuation in the liver which appears to primarily represent thrombosed left portal vein, thrombosed anterior branch of the right portal vein, and thrombosis of the more peripheral posterior branch of the portal vein. The main portal vein and the extra hepatic portions of the portal vein are well opacified. It would be difficult to exclude some associated intraductal dilatation. The gallbladder is not distended. No definitive mass in the porta hepatis. There is cavernous transformation in the  anterior aspect of the porta hepatis.  Pancreas: Unremarkable. No pancreatic ductal dilatation or surrounding inflammatory changes.  Spleen: There is low-attenuation in the medial aspect of the spleen on series 2, image 19. Few other regions of low attenuation, subtle and small, are seen on image 18. The spleen measures 13.5 cm in cranial caudal dimension, mildly enlarged.  Adrenals/Urinary Tract: The adrenal glands and kidneys are unremarkable. The ureters are normal. There is high attenuation in the posterior bladder on image 85, likely tiny stones. There is air in the anterior bladder. There is a collection of soft tissue, debris, fluid, and air between the anterior left dome of the bladder and the sigmoid colon. This collection extends superiorly as seen on image 72 of series 2. The collection exerts mass effect on the top of the bladder. There also appears to be some soft tissue nodularity associated with the bladder in this region. This abnormality is well seen on sagittal image 62 and appears to measure 8.2 x 4.7 by 4.6 cm in cranial caudal, anterior-posterior, and transverse dimensions.  Stomach/Bowel: The stomach and small bowel are normal. There is thickening in the wall of the sigmoid colon in addition to diverticuli. The remainder of  the colon is unremarkable. The appendix is normal.  Vascular/Lymphatic: There is atherosclerotic change in the nonaneurysmal aorta and iliac vessels. There is an enlarged portacaval vein measuring 2 cm on axial image 32. There is retroperitoneal adenopathy as well with a left para or aortic node measuring 2 cm on image 41 of series 2. No definite adenopathy within the pelvis.  Reproductive: Prostate is unremarkable.  Other: There is ascites in the abdomen and pelvis. There is increased attenuation in the subcutaneous and intra-abdominal fat consistent with volume overload.  Musculoskeletal: No bony metastatic disease  noted.  IMPRESSION: 1. There is thrombosis of the left portal vein, the anterior branch of the right portal vein, and the more peripheral aspect of the right portal vein posterior branch. The extrahepatic portions of the portal vein are well opacified. It would be difficult to exclude mild intrahepatic ductal dilatation although no extrahepatic ductal dilatation is noted. An underlying cause for the portal vein thrombosis is not seen on this study. No definitive mass in the porta hepatis or pancreas identified. 2. There is a collection of air, debris, and soft tissue thickening between the sigmoid colon and the bladder dome. This explains the patient's symptoms. However, it is unclear whether the abnormality arises from the bladder given the nodular thickening of the bladder wall or arises from the sigmoid colon. A bladder carcinoma invading adjacent colon should be considered. The linear extension of air in the collection along the expected course of the urachal remnant raises the possibility of an adenocarcinoma forming in the urachus involving the bladder and adjacent sigmoid colon. A fistulous connection between the colon and bladder due to colonic diverticular disease or a colonic neoplasm is possible as well. 3. Adenopathy in the retroperitoneal region and porta hepatis could be reactive or metastatic. 4. Mild splenomegaly. The low-attenuation in the periphery of the spleen medially may represent infarct. 5. Ascites, likely due to the liver abnormalities.  Findings called to Dr. Alfred Levins.   Electronically Signed   By: Dorise Bullion III M.D   On: 12/14/2018 14:28  Assessment:   Colovesicular fistula.  Because of fistula include but not limited to bladder mass, colon mass, or diverticulosis.  Incidental anemia  Plan:   Unclear the exact cause of the fistula at this point.  Recommend maintaining a clear liquid diet for now until the initial white count and  hopefully the urinary symptoms starts to resolve while work-up is being completed to figure out exact etiology.  This case with urology in due to the increased risk of worsening perforation if a colonoscopy is done at this point, urology team has graciously agreed to a cystoscopy.  Hope with the cystoscopy will yield some visualization of the area and possible biopsies will give [place] further insight into the cause.  Agree with blood transfusion at this time.  Depending on the final diagnosis, patient may undergo initial nonsurgical management prior to the more definitive surgical resection of the area.  I discussed with the patient briefly the diagnostic work-up that we will follow in the next several days.  We will discussed risk benefits and alternatives to the possible surgical options at a later date.

## 2018-12-15 ENCOUNTER — Inpatient Hospital Stay: Payer: Self-pay

## 2018-12-15 ENCOUNTER — Encounter: Payer: Self-pay | Admitting: Physician Assistant

## 2018-12-15 DIAGNOSIS — N3289 Other specified disorders of bladder: Secondary | ICD-10-CM

## 2018-12-15 DIAGNOSIS — N321 Vesicointestinal fistula: Secondary | ICD-10-CM

## 2018-12-15 DIAGNOSIS — D649 Anemia, unspecified: Secondary | ICD-10-CM

## 2018-12-15 DIAGNOSIS — I81 Portal vein thrombosis: Secondary | ICD-10-CM

## 2018-12-15 DIAGNOSIS — Z87891 Personal history of nicotine dependence: Secondary | ICD-10-CM

## 2018-12-15 LAB — CBC
HCT: 19.1 % — ABNORMAL LOW (ref 39.0–52.0)
Hemoglobin: 6.1 g/dL — ABNORMAL LOW (ref 13.0–17.0)
MCH: 28.6 pg (ref 26.0–34.0)
MCHC: 31.9 g/dL (ref 30.0–36.0)
MCV: 89.7 fL (ref 80.0–100.0)
PLATELETS: 256 10*3/uL (ref 150–400)
RBC: 2.13 MIL/uL — ABNORMAL LOW (ref 4.22–5.81)
RDW: 16.8 % — ABNORMAL HIGH (ref 11.5–15.5)
WBC: 11.1 10*3/uL — AB (ref 4.0–10.5)
nRBC: 0 % (ref 0.0–0.2)

## 2018-12-15 LAB — IRON AND TIBC
Iron: 30 ug/dL — ABNORMAL LOW (ref 45–182)
SATURATION RATIOS: 20 % (ref 17.9–39.5)
TIBC: 151 ug/dL — ABNORMAL LOW (ref 250–450)
UIBC: 122 ug/dL

## 2018-12-15 LAB — BASIC METABOLIC PANEL
Anion gap: 6 (ref 5–15)
BUN: 12 mg/dL (ref 6–20)
CO2: 25 mmol/L (ref 22–32)
Calcium: 7.9 mg/dL — ABNORMAL LOW (ref 8.9–10.3)
Chloride: 105 mmol/L (ref 98–111)
Creatinine, Ser: 0.7 mg/dL (ref 0.61–1.24)
GFR calc Af Amer: >60 mL/min (ref >60)
Glucose, Bld: 122 mg/dL — ABNORMAL HIGH (ref 70–99)
Potassium: 4 mmol/L (ref 3.5–5.1)
Sodium: 136 mmol/L (ref 135–145)

## 2018-12-15 LAB — PREPARE RBC (CROSSMATCH)

## 2018-12-15 LAB — PROTIME-INR
INR: 1.15
Prothrombin Time: 14.6 seconds (ref 11.4–15.2)

## 2018-12-15 LAB — LACTATE DEHYDROGENASE: LDH: 93 U/L — ABNORMAL LOW (ref 98–192)

## 2018-12-15 LAB — RETICULOCYTES
Immature Retic Fract: 22.1 % — ABNORMAL HIGH (ref 2.3–15.9)
RBC.: 2.69 MIL/uL — ABNORMAL LOW (ref 4.22–5.81)
RETIC CT PCT: 2.6 % (ref 0.4–3.1)
Retic Count, Absolute: 71 10*3/uL (ref 19.0–186.0)

## 2018-12-15 LAB — FERRITIN: Ferritin: 1032 ng/mL — ABNORMAL HIGH (ref 24–336)

## 2018-12-15 MED ORDER — MIDAZOLAM HCL 5 MG/5ML IJ SOLN
INTRAMUSCULAR | Status: AC
  Filled 2018-12-15: qty 5

## 2018-12-15 MED ORDER — SODIUM CHLORIDE 0.9% IV SOLUTION: Freq: Once | INTRAVENOUS | Status: DC

## 2018-12-15 MED ORDER — LIDOCAINE HCL (PF) 1 % IJ SOLN
INTRAMUSCULAR | Status: AC | PRN
  Administered 2018-12-15: 10 mL

## 2018-12-15 MED ORDER — MIDAZOLAM HCL 2 MG/2ML IJ SOLN
INTRAMUSCULAR | Status: AC | PRN
  Administered 2018-12-15 (×5): 1 mg via INTRAVENOUS

## 2018-12-15 MED ORDER — FENTANYL CITRATE (PF) 100 MCG/2ML IJ SOLN
INTRAMUSCULAR | Status: AC | PRN
  Administered 2018-12-15 (×2): 50 ug via INTRAVENOUS

## 2018-12-15 MED ORDER — FENTANYL CITRATE (PF) 100 MCG/2ML IJ SOLN
INTRAMUSCULAR | Status: AC
  Filled 2018-12-15: qty 4

## 2018-12-15 MED ORDER — PIPERACILLIN-TAZOBACTAM 3.375 G IVPB
3.3750 g | Freq: Three times a day (TID) | INTRAVENOUS | Status: DC
  Administered 2018-12-15 – 2018-12-16 (×4): 3.375 g via INTRAVENOUS
  Filled 2018-12-15 (×4): qty 50

## 2018-12-15 NOTE — Progress Notes (Signed)
[place] [facility] [place] NAME: Tony Gillespie    MR#:  [id number]  DATE OF BIRTH:  02/17/1971  SUBJECTIVE:  patient came in with having foul-smelling urine. Weight loss and loss of appetite. This is been going on for several weeks. He just returned from a trip to [place] and [place].  Found to have hemoglobin of 6.7. No family in the room  REVIEW OF SYSTEMS:   Review of Systems  Constitutional: Negative for chills, fever and weight loss.  HENT: Negative for ear discharge, ear pain and nosebleeds.   Eyes: Negative for blurred vision, pain and discharge.  Respiratory: Negative for sputum production, shortness of breath, wheezing and stridor.   Cardiovascular: Negative for chest pain, palpitations, orthopnea and PND.  Gastrointestinal: Negative for abdominal pain, diarrhea, nausea and vomiting.  Genitourinary: Positive for dysuria and hematuria. Negative for frequency and urgency.  Musculoskeletal: Negative for back pain and joint pain.  Neurological: Positive for weakness. Negative for sensory change, speech change and focal weakness.  Psychiatric/Behavioral: Negative for depression and hallucinations. The patient is not nervous/anxious.    Tolerating Diet:yes Tolerating PT:   DRUG ALLERGIES:   Allergies  Allergen Reactions  . Erythromycin Nausea And Vomiting    VITALS:  Blood pressure 98/68, pulse 82, temperature 98.7 F (37.1 C), temperature source Oral, resp. rate 16, height 5\' 11"  (1.803 m), weight 70.7 kg, SpO2 99 %.  PHYSICAL EXAMINATION:   Physical Exam  GENERAL:  48 y.o.-year-old patient lying in the bed with no acute distress. Pallor + weak EYES: Pupils equal, round, reactive to light and accommodation. No scleral icterus. Extraocular muscles intact.  HEENT: Head atraumatic, normocephalic. Oropharynx and nasopharynx clear.  NECK:  Supple, no jugular venous distention. No thyroid enlargement, no tenderness.  LUNGS:  Normal breath sounds bilaterally, no wheezing, rales, rhonchi. No use of accessory muscles of respiration.  CARDIOVASCULAR: S1, S2 normal. No murmurs, rubs, or gallops.  ABDOMEN: Soft, nontender, nondistended. Bowel sounds present. No organomegaly or mass.  EXTREMITIES: No cyanosis, clubbing or edema b/l.    NEUROLOGIC: Cranial nerves II through XII are intact. No focal Motor or sensory deficits b/l.   PSYCHIATRIC:  patient is alert and oriented x 3.  SKIN: No obvious rash, lesion, or ulcer.   LABORATORY PANEL:  CBC Recent Labs  Lab 12/15/18 0511  WBC 11.1*  HGB 6.1*  HCT 19.1*  PLT 256    Chemistries  Recent Labs  Lab 12/14/18 0953 12/15/18 0511  NA 132* 136  K 4.3 4.0  CL 102 105  CO2 24 25  GLUCOSE 110* 122*  BUN 10 12  CREATININE 0.97 0.70  CALCIUM 8.2* 7.9*  AST 52*  --   ALT 49*  --   ALKPHOS 242*  --   BILITOT 1.7*  --    Cardiac Enzymes No results for input(s): TROPONINI in the last 168 hours. RADIOLOGY:  Ct Abdomen Pelvis W Contrast  Result Date: 12/14/2018 CLINICAL DATA:  Fecal matter in urine for 3 days. Evaluate for fistula. EXAM: CT ABDOMEN AND PELVIS WITH CONTRAST TECHNIQUE: Multidetector CT imaging of the abdomen and pelvis was performed using the standard protocol following bolus administration of intravenous contrast. CONTRAST:  168mL OMNIPAQUE IOHEXOL 300 MG/ML SOLN, 74mL ISOVUE-300 IOPAMIDOL (ISOVUE-300) INJECTION 61% COMPARISON:  None. FINDINGS: Lower chest: Mild dependent atelectasis and tiny pleural effusions. The lower chest is otherwise normal. Hepatobiliary: There is branching low-attenuation in the liver which appears to primarily represent thrombosed left portal  vein, thrombosed anterior branch of the right portal vein, and thrombosis of the more peripheral posterior branch of the portal vein. The main portal vein and the extra hepatic portions of the portal vein are well opacified. It would be difficult to exclude some associated intraductal  dilatation. The gallbladder is not distended. No definitive mass in the porta hepatis. There is cavernous transformation in the anterior aspect of the porta hepatis. Pancreas: Unremarkable. No pancreatic ductal dilatation or surrounding inflammatory changes. Spleen: There is low-attenuation in the medial aspect of the spleen on series 2, image 19. Few other regions of low attenuation, subtle and small, are seen on image 18. The spleen measures 13.5 cm in cranial caudal dimension, mildly enlarged. Adrenals/Urinary Tract: The adrenal glands and kidneys are unremarkable. The ureters are normal. There is high attenuation in the posterior bladder on image 85, likely tiny stones. There is air in the anterior bladder. There is a collection of soft tissue, debris, fluid, and air between the anterior left dome of the bladder and the sigmoid colon. This collection extends superiorly as seen on image 72 of series 2. The collection exerts mass effect on the top of the bladder. There also appears to be some soft tissue nodularity associated with the bladder in this region. This abnormality is well seen on sagittal image 62 and appears to measure 8.2 x 4.7 by 4.6 cm in cranial caudal, anterior-posterior, and transverse dimensions. Stomach/Bowel: The stomach and small bowel are normal. There is thickening in the wall of the sigmoid colon in addition to diverticuli. The remainder of the colon is unremarkable. The appendix is normal. Vascular/Lymphatic: There is atherosclerotic change in the nonaneurysmal aorta and iliac vessels. There is an enlarged portacaval vein measuring 2 cm on axial image 32. There is retroperitoneal adenopathy as well with a left para or aortic node measuring 2 cm on image 41 of series 2. No definite adenopathy within the pelvis. Reproductive: Prostate is unremarkable. Other: There is ascites in the abdomen and pelvis. There is increased attenuation in the subcutaneous and intra-abdominal fat consistent with  volume overload. Musculoskeletal: No bony metastatic disease noted. IMPRESSION: 1. There is thrombosis of the left portal vein, the anterior branch of the right portal vein, and the more peripheral aspect of the right portal vein posterior branch. The extrahepatic portions of the portal vein are well opacified. It would be difficult to exclude mild intrahepatic ductal dilatation although no extrahepatic ductal dilatation is noted. An underlying cause for the portal vein thrombosis is not seen on this study. No definitive mass in the porta hepatis or pancreas identified. 2. There is a collection of air, debris, and soft tissue thickening between the sigmoid colon and the bladder dome. This explains the patient's symptoms. However, it is unclear whether the abnormality arises from the bladder given the nodular thickening of the bladder wall or arises from the sigmoid colon. A bladder carcinoma invading adjacent colon should be considered. The linear extension of air in the collection along the expected course of the urachal remnant raises the possibility of an adenocarcinoma forming in the urachus involving the bladder and adjacent sigmoid colon. A fistulous connection between the colon and bladder due to colonic diverticular disease or a colonic neoplasm is possible as well. 3. Adenopathy in the retroperitoneal region and porta hepatis could be reactive or metastatic. 4. Mild splenomegaly. The low-attenuation in the periphery of the spleen medially may represent infarct. 5. Ascites, likely due to the liver abnormalities. Findings called to Dr.  Alfred Levins. Electronically Signed   By: Dorise Bullion III M.D   On: 12/14/2018 14:28   ASSESSMENT AND PLAN:  Deante Blough  is a 48 y.o. male with a known history of no medical issues, did not go to a physician for last 2 years and does not take any prescription medicines at home.  For last 1 to 2 months had some stomach virus episodes, with diarrhea.  Since then he had  been having on and off episodes of loose stools and stomach upset. For last 1 month he also started noticing dirty looking urine which lately converted having stool and gas coming through the urine.  He also had few episodes where he has blood coming through the urine.  1. Symptomatic anemia with hematuria and foul-smelling urine with abnormal CT scan showing suspected bladder mass and significant: Colovesicle fistula -patient is status post 18gauge core biopsy of 1.8 time 2.4 cm paraaortic retroperitoneal lymph node CT guided-- by Dr. Kathlene Cote -oncology consultation with Dr. Rogue Bussing -urology consultation with Dr. Rayburn Ma appreciated -hemoglobin 6.7--- one unit blood transfusion-- check hemoglobin tomorrow  2. UTI -IV Zosyn  3. 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 -This was noted on CT scan of the abdomen  4. DVT SCD  Case discussed with Care Management/Social Worker. Management plans discussed with the patient and they are in agreement.  CODE STATUS: full code  DVT Prophylaxis: SCD  TOTAL TIME TAKING CARE OF THIS PATIENT: *30* minutes.  >50% time spent on counselling and coordination of care  POSSIBLE D/C IN *2-3* DAYS, DEPENDING ON CLINICAL CONDITION.  Note: This dictation was prepared with Dragon dictation along with smaller phrase technology. Any transcriptional errors that result from this process are unintentional.  Fritzi Mandes M.D on 12/15/2018 at 3:57 PM  Between 7am to 6pm - Pager - [phone]  After 6pm go to [email or web address] - password [email or web address] [facility]  Office  [phone]  CC: Primary care physician; Patient, No Pcp PerPatient ID: TREVEON BOURCIER, male   DOB: 02-25-1971, 48 y.o.   MRN: [id number]

## 2018-12-15 NOTE — Progress Notes (Signed)
Pt reports having "night sweats" and "tremors" which started approx. 2 months ago while pt was in [place].  Pt has no complaints of cough, or fever.

## 2018-12-15 NOTE — Consult Note (Signed)
[facility] CONSULT NOTE  Patient Care Team: Patient, No Pcp Per as PCP - General (General Practice)  CHIEF COMPLAINTS/PURPOSE OF CONSULTATION: Malignancy/colovesical  HISTORY OF PRESENTING ILLNESS:  Tony Gillespie 48 y.o.  male with no significant past medical history is currently in the hospital for abdominal discomfort/and also noted to have passing feces in his stool over the last month or so.  Patient has been traveling as part of his volunteering service when he was feeling his self while in [place] sometime in fall of last year.  Patient recently traveled to [place] and [place] in the beginning of the [place] he had episodes of chills without fevers/and sweats intermittently.  He also noted to have passing stool while urinating/also with significant foul smell.  Noted to have blood in stools and also urine.  Also noted to have passing gas in the stool.  Noted to have progressive shortness of breath on exertion.  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  Also notes to have mild splenomegaly/mild ascites; and also portal vein thrombosis.    Review of Systems  Constitutional: Positive for chills, malaise/fatigue and weight loss. Negative for diaphoresis and fever.  HENT: Negative for nosebleeds and sore throat.   Eyes: Negative for double vision.  Respiratory: Positive for shortness of breath. Negative for cough, hemoptysis, sputum production and wheezing.        Shortness of breath on exertion.   Cardiovascular: Negative for chest pain, palpitations, orthopnea and leg swelling.  Gastrointestinal: Positive for blood in stool. Negative for abdominal pain, constipation, diarrhea, heartburn, melena, nausea and vomiting.  Genitourinary: Positive for hematuria. Negative for dysuria, frequency and urgency.       Passing feces in urine.   Musculoskeletal: Negative for back pain and joint pain.  Skin: Negative.   Negative for itching and rash.  Neurological: Positive for weakness. Negative for dizziness, tingling, focal weakness and headaches.  Endo/Heme/Allergies: Does not bruise/bleed easily.  Psychiatric/Behavioral: Negative for depression. The patient is not nervous/anxious and does not have insomnia.      MEDICAL HISTORY:  History reviewed. No pertinent past medical history.  SURGICAL HISTORY: Past Surgical History:  Procedure Laterality Date  . SPINAL FUSION    . THORACIC FUSION    . TONSILLECTOMY      SOCIAL HISTORY: Social History   Socioeconomic History  . Marital status: Single    Spouse name: Not on file  . Number of children: Not on file  . Years of education: Not on file  . Highest education level: Not on file  Occupational History  . Occupation: Curator  . Financial resource strain: Not on file  . Food insecurity:    Worry: Not on file    Inability: Not on file  . Transportation needs:    Medical: Not on file    Non-medical: Not on file  Tobacco Use  . Smoking status: Former Smoker    Packs/day: 0.50    Types: Cigarettes  . Smokeless tobacco: Never Used  Substance and Sexual Activity  . Alcohol use: No  . Drug use: No  . Sexual activity: Not on file  Lifestyle  . Physical activity:    Days per week: Not on file    Minutes per session: Not on file  . Stress: Not on file  Relationships  . Social connections:    Talks on phone: Not on file    Gets together: Not on  file    Attends religious service: Not on file    Active member of club or organization: Not on file    Attends meetings of clubs or organizations: Not on file    Relationship status: Not on file  . Intimate partner violence:    Fear of current or ex partner: Not on file    Emotionally abused: Not on file    Physically abused: Not on file    Forced sexual activity: Not on file  Other Topics Concern  . Not on file  Social History Narrative    live in [place] with  mother; never married/ no children; brother- [place]; quit smoking nov 2019. No alcohol. Associate in Radio broadcast assistant.     FAMILY HISTORY: Family History  Problem Relation Age of Onset  . Colon polyps Father   . Heart disease Father   . Kidney disease Father   . Diabetes Maternal Aunt   . Prostate cancer Paternal Uncle     ALLERGIES:  is allergic to erythromycin.  MEDICATIONS:  Current Facility-Administered Medications  Medication Dose Route Frequency Provider Last Rate Last Dose  . 0.9 %  sodium chloride infusion (Manually program via Guardrails IV Fluids)   Intravenous Once Fritzi Mandes, MD      . acetaminophen (TYLENOL) tablet 650 mg  650 mg Oral Q6H PRN Vaughan Basta, MD   650 mg at 12/14/18 1806  . docusate sodium (COLACE) capsule 100 mg  100 mg Oral BID PRN Vaughan Basta, MD      . feeding supplement (ENSURE ENLIVE) (ENSURE ENLIVE) liquid 237 mL  237 mL Oral BID BM Sakai, Isami, DO      . fentaNYL (SUBLIMAZE) 100 MCG/2ML injection           . midazolam (VERSED) 5 MG/5ML injection           . morphine 2 MG/ML injection 2 mg  2 mg Intravenous Q4H PRN Vaughan Basta, MD   2 mg at 12/15/18 0340  . piperacillin-tazobactam (ZOSYN) IVPB 3.375 g  3.375 g Intravenous Q8H Fritzi Mandes, MD 12.5 mL/hr at 12/15/18 1310 3.375 g at 12/15/18 1310      .  PHYSICAL EXAMINATION:  Vitals:   12/15/18 1555 12/15/18 1619  BP: 98/68 97/64  Pulse: 82 74  Resp: 16 20  Temp:  98.2 F (36.8 C)  SpO2: 99% 98%   Filed Weights   12/14/18 0839 12/14/18 1638  Weight: 160 lb (72.6 kg) 155 lb 13.8 oz (70.7 kg)    Physical Exam  Constitutional: He is oriented to person, place, and time.  Thin built male patient.  Resting in the bed comfortably.  HENT:  Head: Normocephalic and atraumatic.  Mouth/Throat: Oropharynx is clear and moist. No oropharyngeal exudate.  Eyes: Pupils are equal, round, and reactive to light.  Neck: Normal range of motion. Neck supple.   Cardiovascular: Normal rate and regular rhythm.  Pulmonary/Chest: Breath sounds normal. No respiratory distress. He has no wheezes.  Abdominal: Soft. Bowel sounds are normal. He exhibits no distension and no mass. There is no abdominal tenderness. There is no rebound and no guarding.  Musculoskeletal: Normal range of motion.        General: No tenderness or edema.  Neurological: He is alert and oriented to person, place, and time.  Skin: Skin is warm.  Psychiatric: Affect normal.     LABORATORY DATA:  I have reviewed the data as listed Lab Results  Component Value Date   WBC 11.1 (H) 12/15/2018  HGB 6.1 (L) 12/15/2018   HCT 19.1 (L) 12/15/2018   MCV 89.7 12/15/2018   PLT 256 12/15/2018   Recent Labs    12/14/18 0953 12/15/18 0511  NA 132* 136  K 4.3 4.0  CL 102 105  CO2 24 25  GLUCOSE 110* 122*  BUN 10 12  CREATININE 0.97 0.70  CALCIUM 8.2* 7.9*  GFRNONAA >60 >60  GFRAA >60 >60  PROT 6.8  --   ALBUMIN 2.2*  --   AST 52*  --   ALT 49*  --   ALKPHOS 242*  --   BILITOT 1.7*  --   BILIDIR 0.5*  --   IBILI 1.2*  --     RADIOGRAPHIC STUDIES: I have personally reviewed the radiological images as listed and agreed with the findings in the report. Ct Guided Needle Placement  Result Date: 12/15/2018 CLINICAL DATA:  Colovesical fistula, sigmoid colonic mass, portal vein thrombosis and left para-aortic retroperitoneal lymphadenopathy. The patient presents for biopsy of the left para-aortic lymph node in order to try to establish a diagnosis. EXAM: CT GUIDED CORE BIOPSY OF LEFT ABDOMINAL RETROPERITONEAL LYMPH NODE ANESTHESIA/SEDATION: 5.0 mg IV Versed; 100 mcg IV Fentanyl Total Moderate Sedation Time:  19 minutes. The patient's level of consciousness and physiologic status were continuously monitored during the procedure by Radiology nursing. PROCEDURE: The procedure risks, benefits, and alternatives were explained to the patient. Questions regarding the procedure were  encouraged and answered. The patient understands and consents to the procedure. A time-out was performed prior to initiating the procedure. CT was performed in a prone position. The left trans lumbar region was prepped with chlorhexidine in a sterile fashion, and a sterile drape was applied covering the operative field. A sterile gown and sterile gloves were used for the procedure. Local anesthesia was provided with 1% Lidocaine. Under CT guidance, a 17 gauge trocar needle was advanced to the level of a left para-aortic retroperitoneal lymph node. Three separate coaxial 18 gauge core biopsy samples were obtained. Core samples were submitted in formalin. Additional CT was performed after outer needle removal. COMPLICATIONS: None FINDINGS: Left para-aortic lymph node medial to the mid left kidney was localized. This measures approximately 1.8 x 2.4 cm. Solid tissue was obtained with biopsy. IMPRESSION: CT-guided core biopsy performed of a left para-aortic abdominal retroperitoneal lymph node. Electronically Signed   By: Aletta Edouard M.D.   On: 12/15/2018 16:17   Ct Abdomen Pelvis W Contrast  Result Date: 12/14/2018 CLINICAL DATA:  Fecal matter in urine for 3 days. Evaluate for fistula. EXAM: CT ABDOMEN AND PELVIS WITH CONTRAST TECHNIQUE: Multidetector CT imaging of the abdomen and pelvis was performed using the standard protocol following bolus administration of intravenous contrast. CONTRAST:  121mL OMNIPAQUE IOHEXOL 300 MG/ML SOLN, 32mL ISOVUE-300 IOPAMIDOL (ISOVUE-300) INJECTION 61% COMPARISON:  None. FINDINGS: Lower chest: Mild dependent atelectasis and tiny pleural effusions. The lower chest is otherwise normal. Hepatobiliary: There is branching low-attenuation in the liver which appears to primarily represent thrombosed left portal vein, thrombosed anterior branch of the right portal vein, and thrombosis of the more peripheral posterior branch of the portal vein. The main portal vein and the extra hepatic  portions of the portal vein are well opacified. It would be difficult to exclude some associated intraductal dilatation. The gallbladder is not distended. No definitive mass in the porta hepatis. There is cavernous transformation in the anterior aspect of the porta hepatis. Pancreas: Unremarkable. No pancreatic ductal dilatation or surrounding inflammatory changes. Spleen: There is low-attenuation  in the medial aspect of the spleen on series 2, image 19. Few other regions of low attenuation, subtle and small, are seen on image 18. The spleen measures 13.5 cm in cranial caudal dimension, mildly enlarged. Adrenals/Urinary Tract: The adrenal glands and kidneys are unremarkable. The ureters are normal. There is high attenuation in the posterior bladder on image 85, likely tiny stones. There is air in the anterior bladder. There is a collection of soft tissue, debris, fluid, and air between the anterior left dome of the bladder and the sigmoid colon. This collection extends superiorly as seen on image 72 of series 2. The collection exerts mass effect on the top of the bladder. There also appears to be some soft tissue nodularity associated with the bladder in this region. This abnormality is well seen on sagittal image 62 and appears to measure 8.2 x 4.7 by 4.6 cm in cranial caudal, anterior-posterior, and transverse dimensions. Stomach/Bowel: The stomach and small bowel are normal. There is thickening in the wall of the sigmoid colon in addition to diverticuli. The remainder of the colon is unremarkable. The appendix is normal. Vascular/Lymphatic: There is atherosclerotic change in the nonaneurysmal aorta and iliac vessels. There is an enlarged portacaval vein measuring 2 cm on axial image 32. There is retroperitoneal adenopathy as well with a left para or aortic node measuring 2 cm on image 41 of series 2. No definite adenopathy within the pelvis. Reproductive: Prostate is unremarkable. Other: There is ascites in the  abdomen and pelvis. There is increased attenuation in the subcutaneous and intra-abdominal fat consistent with volume overload. Musculoskeletal: No bony metastatic disease noted. IMPRESSION: 1. There is thrombosis of the left portal vein, the anterior branch of the right portal vein, and the more peripheral aspect of the right portal vein posterior branch. The extrahepatic portions of the portal vein are well opacified. It would be difficult to exclude mild intrahepatic ductal dilatation although no extrahepatic ductal dilatation is noted. An underlying cause for the portal vein thrombosis is not seen on this study. No definitive mass in the porta hepatis or pancreas identified. 2. There is a collection of air, debris, and soft tissue thickening between the sigmoid colon and the bladder dome. This explains the patient's symptoms. However, it is unclear whether the abnormality arises from the bladder given the nodular thickening of the bladder wall or arises from the sigmoid colon. A bladder carcinoma invading adjacent colon should be considered. The linear extension of air in the collection along the expected course of the urachal remnant raises the possibility of an adenocarcinoma forming in the urachus involving the bladder and adjacent sigmoid colon. A fistulous connection between the colon and bladder due to colonic diverticular disease or a colonic neoplasm is possible as well. 3. Adenopathy in the retroperitoneal region and porta hepatis could be reactive or metastatic. 4. Mild splenomegaly. The low-attenuation in the periphery of the spleen medially may represent infarct. 5. Ascites, likely due to the liver abnormalities. Findings called to Dr. Alfred Levins. 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  Patient awaiting CT scan guided biopsy of the retroperitoneal lymph node.  Reviewed urology/surgery evaluation.  #Severe anemia hemoglobin 6.1-question blood loss/anemia of chronic disease check iron studies LDH.  Transfuse to  keep hemoglobin on 8.  #Portal vein thrombosis-likely related to underlying malignancy/hold off anticoagulation at this time as patient is severely anemic/GU/GI bleeding.  # Thank you Dr. Posey Pronto for allowing me to participate in the care of your pleasant patient. Please do not hesitate to contact me with questions or concerns in the interim.  The above plan of care was discussed with the patient detail.  He agrees.  All questions were answered. The patient knows to call the clinic with any problems, questions or concerns.    Cammie Sickle, MD 12/15/2018 4:44 PM

## 2018-12-15 NOTE — Progress Notes (Signed)
Pharmacy Antibiotic Note  Tony Gillespie is a 48 y.o. male admitted on 12/14/2018 with intra-abdominal source.  Pharmacy has been consulted for zosyn dosing.  Plan: Zosyn 3.375g IV q8h (4 hour infusion).  Height: 5\' 11"  (180.3 cm) Weight: 155 lb 13.8 oz (70.7 kg) IBW/kg (Calculated) : 75.3  Temp (24hrs), Avg:99.8 F (37.7 C), Min:98 F (36.7 C), Max:101.4 F (38.6 C)  Recent Labs  Lab 12/14/18 0953  WBC 16.8*  CREATININE 0.97  LATICACIDVEN 1.4    Estimated Creatinine Clearance: 95.2 mL/min (by C-G formula based on SCr of 0.97 mg/dL).    Allergies  Allergen Reactions  . Erythromycin Nausea And Vomiting    Thank you for allowing pharmacy to be a part of this patient's care.  Tobie Lords, PharmD, BCPS Clinical Pharmacist 12/15/2018

## 2018-12-15 NOTE — Procedures (Addendum)
Interventional Radiology Procedure Note  Procedure: CT Guided Biopsy of Left Retroperitoneal Lymph Node  Complications: None  Estimated Blood Loss: < 10 mL  Findings: 18 G core biopsy of 1.8 x 2.4 cm left para-aortic RP LN performed under CT guidance.  Three core samples obtained and sent to Pathology.  Venetia Night. Kathlene Cote, M.D Pager:  [phone]

## 2018-12-15 NOTE — Assessment & Plan Note (Addendum)
#  48 year old male patient currently admitted to hospital for passing feculent matter in urine/CT scan abdomen pelvis concerning for colovesical fistula/also portal vein thrombosis  # Colovesical fistula/with retroperitoneal adenopathy-highly concerning for malignancy.   CT-guided biopsy of the retroperitoneal lymph node-NEG for malignancy.  Bladder biopsy negative for malignancy.  Also-flex sig positive for polyp-no obvious evidence of malignancy.   #Abdominal distention-CT scan shows ileus/colonic obstruction.  NG tube as per surgery service.  #Severe anemia at presentation hemoglobin today 11.5.  #Portal vein thrombosis-asymptomatic at this time.sec possible underlying malignancy; hold off anticoagulation at this time.  #UTI-/leukocytosis gram-negative secondary to colovesical fistula on Zosyn.   #Defer to surgery for surgical options at this time.  Medical oncology will follow.

## 2018-12-15 NOTE — Consult Note (Signed)
Chief Complaint: Patient was seen in consultation today for lymph node biopsy.  Referring Physician(s): Dr. Hollice Espy  Supervising Physician: Aletta Edouard  Patient Status: [facility] - In-pt  History of Present Illness: Tony Gillespie is a 48 y.o. male with no significant past medical history who presented to [facility] ED on 12/14/18 with complaints of dysuria associated with passage of air, stool and blood x 4 weeks. He also complained of intermittent diarrhea, chills, night sweats and 25 lb weight loss in last month. He has been traveling to various countries for volunteer work and attributes much of this weight loss to disliking the food while traveling and thus eating less. He is not regularly followed by any physician and does not take any medications at home. CT abdomen/pelvis with contrast was performed which showed portal vein thrombosis; a collection of air, debris and soft tissue thickening between the sigmoid colon and bladder dome; adenopathy in the retroperitoneal region. He was also found to be profoundly anemic with hemoglobin of 7.7 - he was transfused 1 unit PRBC. He was admitted and consults were placed to urology and surgery for further evaluation and management. Urology plans to proceed with cystoscopy soon, surgery holding off on colonoscopy at this time due to high risk of bowel perforation. Request has been made to IR for lymph node biopsy to assist in further directing treatment.  Patient reports he continues to have intermittent diarrhea as well as continuous fecal matter and sometimes blood in his urine. It is very uncomfortable to urinate due to burning and pain. He again denies any history of abdominal or pelvic surgery, he states he did have a colonoscopy in 2014 where polyps were removed. He had not had any symptoms until approximately a month ago when he was traveling in another country. He denies any other complaints and is very grateful for the care he has received.     History reviewed. No pertinent past medical history.  Past Surgical History:  Procedure Laterality Date  . SPINAL FUSION    . THORACIC FUSION    . TONSILLECTOMY      Allergies: Erythromycin  Medications: Prior to Admission medications   Medication Sig Start Date End Date Taking? Authorizing Provider  permethrin (ELIMITE) 5 % cream Thoroughly massage cream from head to soles of feet; leave on for 8 to 14 hours before removing (shower or bath) 08/13/17   Coral Spikes, DO     Family History  Problem Relation Age of Onset  . Colon polyps Father   . Heart disease Father   . Kidney disease Father   . Diabetes Maternal Aunt   . Prostate cancer Paternal Uncle     Social History   Socioeconomic History  . Marital status: Single    Spouse name: Not on file  . Number of children: Not on file  . Years of education: Not on file  . Highest education level: Not on file  Occupational History  . Occupation: Curator  . Financial resource strain: Not on file  . Food insecurity:    Worry: Not on file    Inability: Not on file  . Transportation needs:    Medical: Not on file    Non-medical: Not on file  Tobacco Use  . Smoking status: Former Smoker    Packs/day: 0.50    Types: Cigarettes  . Smokeless tobacco: Never Used  Substance and Sexual Activity  . Alcohol use: No  . Drug use: No  .  Sexual activity: Not on file  Lifestyle  . Physical activity:    Days per week: Not on file    Minutes per session: Not on file  . Stress: Not on file  Relationships  . Social connections:    Talks on phone: Not on file    Gets together: Not on file    Attends religious service: Not on file    Active member of club or organization: Not on file    Attends meetings of clubs or organizations: Not on file    Relationship status: Not on file  Other Topics Concern  . Not on file  Social History Narrative  . Not on file     Review of Systems: A 12 point ROS  discussed and pertinent positives are indicated in the HPI above.  All other systems are negative.  Review of Systems  Constitutional: Positive for diaphoresis (sometimes at night). Negative for appetite change, chills, fatigue, fever and unexpected weight change.  Respiratory: Negative for cough and shortness of breath.   Cardiovascular: Negative for chest pain.  Gastrointestinal: Negative for abdominal pain, blood in stool, diarrhea, nausea and vomiting.  Genitourinary: Positive for dysuria and hematuria.       (+) air, stool in urine  Musculoskeletal: Negative for back pain.  Neurological: Negative for dizziness, syncope and headaches.  Psychiatric/Behavioral: Negative for confusion.    Vital Signs: BP 102/70   Pulse 76   Temp 98 F (36.7 C) (Oral)   Resp 18   Ht 5\' 11"  (1.803 m)   Wt 155 lb 13.8 oz (70.7 kg)   SpO2 96%   BMI 21.74 kg/m   Physical Exam Vitals signs and nursing note reviewed.  Constitutional:      General: He is not in acute distress.    Comments: Sitting up in bed watching TV. Very pleasant, talkative, provides good history.  Cardiovascular:     Rate and Rhythm: Normal rate and regular rhythm.  Pulmonary:     Effort: Pulmonary effort is normal.     Breath sounds: Normal breath sounds.  Abdominal:     Palpations: Abdomen is soft.     Tenderness: There is no abdominal tenderness.  Skin:    General: Skin is warm and dry.     Coloration: Skin is pale.     Comments: (+) PIV in right upper extremity x 2  Neurological:     Mental Status: He is alert and oriented to person, place, and time.  Psychiatric:        Mood and Affect: Mood normal.        Behavior: Behavior normal.        Thought Content: Thought content normal.        Judgment: Judgment normal.      MD Evaluation Airway: WNL Heart: WNL Abdomen: WNL Chest/ Lungs: WNL ASA  Classification: 3 Mallampati/Airway Score: One   Imaging: Ct Abdomen Pelvis W Contrast  Result Date:  12/14/2018 CLINICAL DATA:  Fecal matter in urine for 3 days. Evaluate for fistula. EXAM: CT ABDOMEN AND PELVIS WITH CONTRAST TECHNIQUE: Multidetector CT imaging of the abdomen and pelvis was performed using the standard protocol following bolus administration of intravenous contrast. CONTRAST:  179mL OMNIPAQUE IOHEXOL 300 MG/ML SOLN, 51mL ISOVUE-300 IOPAMIDOL (ISOVUE-300) INJECTION 61% COMPARISON:  None. FINDINGS: Lower chest: Mild dependent atelectasis and tiny pleural effusions. The lower chest is otherwise normal. Hepatobiliary: There is branching low-attenuation in the liver which appears to primarily represent thrombosed left portal vein, thrombosed  anterior branch of the right portal vein, and thrombosis of the more peripheral posterior branch of the portal vein. The main portal vein and the extra hepatic portions of the portal vein are well opacified. It would be difficult to exclude some associated intraductal dilatation. The gallbladder is not distended. No definitive mass in the porta hepatis. There is cavernous transformation in the anterior aspect of the porta hepatis. Pancreas: Unremarkable. No pancreatic ductal dilatation or surrounding inflammatory changes. Spleen: There is low-attenuation in the medial aspect of the spleen on series 2, image 19. Few other regions of low attenuation, subtle and small, are seen on image 18. The spleen measures 13.5 cm in cranial caudal dimension, mildly enlarged. Adrenals/Urinary Tract: The adrenal glands and kidneys are unremarkable. The ureters are normal. There is high attenuation in the posterior bladder on image 85, likely tiny stones. There is air in the anterior bladder. There is a collection of soft tissue, debris, fluid, and air between the anterior left dome of the bladder and the sigmoid colon. This collection extends superiorly as seen on image 72 of series 2. The collection exerts mass effect on the top of the bladder. There also appears to be some soft  tissue nodularity associated with the bladder in this region. This abnormality is well seen on sagittal image 62 and appears to measure 8.2 x 4.7 by 4.6 cm in cranial caudal, anterior-posterior, and transverse dimensions. Stomach/Bowel: The stomach and small bowel are normal. There is thickening in the wall of the sigmoid colon in addition to diverticuli. The remainder of the colon is unremarkable. The appendix is normal. Vascular/Lymphatic: There is atherosclerotic change in the nonaneurysmal aorta and iliac vessels. There is an enlarged portacaval vein measuring 2 cm on axial image 32. There is retroperitoneal adenopathy as well with a left para or aortic node measuring 2 cm on image 41 of series 2. No definite adenopathy within the pelvis. Reproductive: Prostate is unremarkable. Other: There is ascites in the abdomen and pelvis. There is increased attenuation in the subcutaneous and intra-abdominal fat consistent with volume overload. Musculoskeletal: No bony metastatic disease noted. IMPRESSION: 1. There is thrombosis of the left portal vein, the anterior branch of the right portal vein, and the more peripheral aspect of the right portal vein posterior branch. The extrahepatic portions of the portal vein are well opacified. It would be difficult to exclude mild intrahepatic ductal dilatation although no extrahepatic ductal dilatation is noted. An underlying cause for the portal vein thrombosis is not seen on this study. No definitive mass in the porta hepatis or pancreas identified. 2. There is a collection of air, debris, and soft tissue thickening between the sigmoid colon and the bladder dome. This explains the patient's symptoms. However, it is unclear whether the abnormality arises from the bladder given the nodular thickening of the bladder wall or arises from the sigmoid colon. A bladder carcinoma invading adjacent colon should be considered. The linear extension of air in the collection along the  expected course of the urachal remnant raises the possibility of an adenocarcinoma forming in the urachus involving the bladder and adjacent sigmoid colon. A fistulous connection between the colon and bladder due to colonic diverticular disease or a colonic neoplasm is possible as well. 3. Adenopathy in the retroperitoneal region and porta hepatis could be reactive or metastatic. 4. Mild splenomegaly. The low-attenuation in the periphery of the spleen medially may represent infarct. 5. Ascites, likely due to the liver abnormalities. Findings called to Dr. Alfred Levins. Electronically  Signed   By: Dorise Bullion III M.D   On: 12/14/2018 14:28    Labs:  CBC: Recent Labs    12/14/18 0953 12/15/18 0511  WBC 16.8* 11.1*  HGB 7.7* 6.1*  HCT 24.1* 19.1*  PLT 322 256    COAGS: Recent Labs    12/15/18 0511  INR 1.15    BMP: Recent Labs    12/14/18 0953 12/15/18 0511  NA 132* 136  K 4.3 4.0  CL 102 105  CO2 24 25  GLUCOSE 110* 122*  BUN 10 12  CALCIUM 8.2* 7.9*  CREATININE 0.97 0.70  GFRNONAA >60 >60  GFRAA >60 >60    LIVER FUNCTION TESTS: Recent Labs    12/14/18 0953  BILITOT 1.7*  AST 52*  ALT 49*  ALKPHOS 242*  PROT 6.8  ALBUMIN 2.2*    TUMOR MARKERS: No results for input(s): AFPTM, CEA, CA199, CHROMGRNA in the last 8760 hours.  Assessment and Plan:  48 y/o M who presented to [facility] ED on 12/14/18 with complaints of dysuria, hematuria, air and stool in urine x 4 weeks as well as intermittent diarrhea, night sweats and weight loss which he attributes to eating less while traveling abroad due to not liking the food. CT abdomen/pelvis shows portal vein thrombosis; a collection of air, debris and soft tissue thickening between the sigmoid colon and bladder dome; adenopathy in the retroperitoneal region. He was also found to be anemic with hgb 7.7 on presentation - he has received 1 unit PRBCs so far. 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 Request to IR for lymph node biopsy to further direct treatment - he has been approved for retroperitoneal lymph node biopsy with moderate sedation planned for this afternoon in IR.  Patient has been NPO since 10 pm last night, he does not take blood thinning medications. Afebrile, hypotensive (100/70 mmHg), WBC 11.1, hgb 6.1 post transfusion - per patient plans to transfuse again today, plt 256, INR 1.15.  Risks and benefits of retroperitoneal lymph node biopsy was discussed with the patient and/or patient's family including, but not limited to bleeding, infection, damage to adjacent structures or low yield requiring additional tests.  All of the questions were answered and there is agreement to proceed.  Consent signed and in chart.   Thank you for this interesting consult.  I greatly enjoyed meeting Tony Gillespie and look forward to participating in their care.  A copy of this report was sent to the requesting provider on this date.  Electronically Signed: Joaquim Nam, PA-C 12/15/2018, 10:18 AM   I spent a total of 40 Minutes  in face to face in clinical consultation, greater than 50% of which was counseling/coordinating care for retroperitoneal lymph node biopsy.

## 2018-12-15 NOTE — Progress Notes (Signed)
Urology Consult Follow Up  Subjective: Patient has yet to receive blood, had a fever last night so blood was held.  Hemoglobin continues to trend down.  Currently NPO.  Anti-infectives: Anti-infectives (From admission, onward)   Start     Dose/Rate Route Frequency Ordered Stop   12/15/18 0415  piperacillin-tazobactam (ZOSYN) IVPB 3.375 g     3.375 g 12.5 mL/hr over 240 Minutes Intravenous Every 8 hours 12/15/18 0413     12/14/18 1045  cefTRIAXone (ROCEPHIN) 1 g in sodium chloride 0.9 % 100 mL IVPB     1 g 200 mL/hr over 30 Minutes Intravenous  Once 12/14/18 1033 12/14/18 1127      Current Facility-Administered Medications  Medication Dose Route Frequency Provider Last Rate Last Dose  . acetaminophen (TYLENOL) tablet 650 mg  650 mg Oral Q6H PRN Vaughan Basta, MD   650 mg at 12/14/18 1806  . docusate sodium (COLACE) capsule 100 mg  100 mg Oral BID PRN Vaughan Basta, MD      . feeding supplement (ENSURE ENLIVE) (ENSURE ENLIVE) liquid 237 mL  237 mL Oral BID BM Sakai, Isami, DO      . morphine 2 MG/ML injection 2 mg  2 mg Intravenous Q4H PRN Vaughan Basta, MD   2 mg at 12/15/18 0340  . piperacillin-tazobactam (ZOSYN) IVPB 3.375 g  3.375 g Intravenous Q8H Fritzi Mandes, MD 12.5 mL/hr at 12/15/18 0501 3.375 g at 12/15/18 0501     Objective: Vital signs in last 24 hours: Temp:  [98 F (36.7 C)-101.4 F (38.6 C)] 98 F (36.7 C) (02/17 0343) Pulse Rate:  [76-100] 76 (02/17 0343) Resp:  [14-18] 18 (02/16 1952) BP: (99-117)/(58-76) 102/70 (02/17 0343) SpO2:  [93 %-99 %] 96 % (02/17 0343) Weight:  [70.7 kg] 70.7 kg (02/16 1638)  Intake/Output from previous day: 02/16 0701 - 02/17 0700 In: 109.4 [I.V.:10; IV Piggyback:99.4] Out: -  Intake/Output this shift: No intake/output data recorded.   Physical Exam Pale-appearing, no acute distress Abdomen soft  Lab Results:  Recent Labs    12/14/18 0953 12/15/18 0511  WBC 16.8* 11.1*  HGB 7.7* 6.1*  HCT  24.1* 19.1*  PLT 322 256   BMET Recent Labs    12/14/18 0953 12/15/18 0511  NA 132* 136  K 4.3 4.0  CL 102 105  CO2 24 25  GLUCOSE 110* 122*  BUN 10 12  CREATININE 0.97 0.70  CALCIUM 8.2* 7.9*   PT/INR Recent Labs    12/15/18 0511  LABPROT 14.6  INR 1.15   ABG No results for input(s): PHART, HCO3 in the last 72 hours.  Invalid input(s): PCO2, PO2  Studies/Results: Ct Abdomen Pelvis W Contrast  Result Date: 12/14/2018 CLINICAL DATA:  Fecal matter in urine for 3 days. Evaluate for fistula. EXAM: CT ABDOMEN AND PELVIS WITH CONTRAST TECHNIQUE: Multidetector CT imaging of the abdomen and pelvis was performed using the standard protocol following bolus administration of intravenous contrast. CONTRAST:  168mL OMNIPAQUE IOHEXOL 300 MG/ML SOLN, 68mL ISOVUE-300 IOPAMIDOL (ISOVUE-300) INJECTION 61% COMPARISON:  None. FINDINGS: Lower chest: Mild dependent atelectasis and tiny pleural effusions. The lower chest is otherwise normal. Hepatobiliary: There is branching low-attenuation in the liver which appears to primarily represent thrombosed left portal vein, thrombosed anterior branch of the right portal vein, and thrombosis of the more peripheral posterior branch of the portal vein. The main portal vein and the extra hepatic portions of the portal vein are well opacified. It would be difficult to exclude some associated intraductal dilatation.  The gallbladder is not distended. No definitive mass in the porta hepatis. There is cavernous transformation in the anterior aspect of the porta hepatis. Pancreas: Unremarkable. No pancreatic ductal dilatation or surrounding inflammatory changes. Spleen: There is low-attenuation in the medial aspect of the spleen on series 2, image 19. Few other regions of low attenuation, subtle and small, are seen on image 18. The spleen measures 13.5 cm in cranial caudal dimension, mildly enlarged. Adrenals/Urinary Tract: The adrenal glands and kidneys are unremarkable.  The ureters are normal. There is high attenuation in the posterior bladder on image 85, likely tiny stones. There is air in the anterior bladder. There is a collection of soft tissue, debris, fluid, and air between the anterior left dome of the bladder and the sigmoid colon. This collection extends superiorly as seen on image 72 of series 2. The collection exerts mass effect on the top of the bladder. There also appears to be some soft tissue nodularity associated with the bladder in this region. This abnormality is well seen on sagittal image 62 and appears to measure 8.2 x 4.7 by 4.6 cm in cranial caudal, anterior-posterior, and transverse dimensions. Stomach/Bowel: The stomach and small bowel are normal. There is thickening in the wall of the sigmoid colon in addition to diverticuli. The remainder of the colon is unremarkable. The appendix is normal. Vascular/Lymphatic: There is atherosclerotic change in the nonaneurysmal aorta and iliac vessels. There is an enlarged portacaval vein measuring 2 cm on axial image 32. There is retroperitoneal adenopathy as well with a left para or aortic node measuring 2 cm on image 41 of series 2. No definite adenopathy within the pelvis. Reproductive: Prostate is unremarkable. Other: There is ascites in the abdomen and pelvis. There is increased attenuation in the subcutaneous and intra-abdominal fat consistent with volume overload. Musculoskeletal: No bony metastatic disease noted. IMPRESSION: 1. There is thrombosis of the left portal vein, the anterior branch of the right portal vein, and the more peripheral aspect of the right portal vein posterior branch. The extrahepatic portions of the portal vein are well opacified. It would be difficult to exclude mild intrahepatic ductal dilatation although no extrahepatic ductal dilatation is noted. An underlying cause for the portal vein thrombosis is not seen on this study. No definitive mass in the porta hepatis or pancreas  identified. 2. There is a collection of air, debris, and soft tissue thickening between the sigmoid colon and the bladder dome. This explains the patient's symptoms. However, it is unclear whether the abnormality arises from the bladder given the nodular thickening of the bladder wall or arises from the sigmoid colon. A bladder carcinoma invading adjacent colon should be considered. The linear extension of air in the collection along the expected course of the urachal remnant raises the possibility of an adenocarcinoma forming in the urachus involving the bladder and adjacent sigmoid colon. A fistulous connection between the colon and bladder due to colonic diverticular disease or a colonic neoplasm is possible as well. 3. Adenopathy in the retroperitoneal region and porta hepatis could be reactive or metastatic. 4. Mild splenomegaly. The low-attenuation in the periphery of the spleen medially may represent infarct. 5. Ascites, likely due to the liver abnormalities. Findings called to Dr. Alfred Levins. 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  Case was discussed with Dr. Lysle Pearl this morning as well as Dr. Kathlene Cote  and Dr. Rogue Bussing day as well as Dr. Posey Pronto.  It would be most helpful for able to biopsy his retroperitoneal adenopathy to confirm that this is malignant and metastatic process.    Dr. Kathlene Cote does feel that the retroperitoneal node possibly could be biopsy, is willing to bring the patient to interventional radiology for an attempt at minimum.  If this is unsuccessful or not able to obtain a decent amount of tissue, will plan for a transurethral procedure likely tomorrow depending on the above.  This was explained in detail to the patient.  He is agreeable this plan.  Urology will continue to follow the patient closely.  Agree with  continued supportive care including transfusion and antibiotics.     LOS: 1 day    Hollice Espy 12/15/2018

## 2018-12-16 ENCOUNTER — Other Ambulatory Visit: Payer: Self-pay | Admitting: Internal Medicine

## 2018-12-16 DIAGNOSIS — Z8042 Family history of malignant neoplasm of prostate: Secondary | ICD-10-CM

## 2018-12-16 DIAGNOSIS — R319 Hematuria, unspecified: Secondary | ICD-10-CM

## 2018-12-16 DIAGNOSIS — N390 Urinary tract infection, site not specified: Secondary | ICD-10-CM

## 2018-12-16 DIAGNOSIS — N329 Bladder disorder, unspecified: Secondary | ICD-10-CM

## 2018-12-16 DIAGNOSIS — Z809 Family history of malignant neoplasm, unspecified: Secondary | ICD-10-CM

## 2018-12-16 LAB — URINE CULTURE: Culture: >=100000 — AB

## 2018-12-16 LAB — CBC
HCT: 22.4 % — ABNORMAL LOW (ref 39.0–52.0)
Hemoglobin: 7.1 g/dL — ABNORMAL LOW (ref 13.0–17.0)
MCH: 28.4 pg (ref 26.0–34.0)
MCHC: 31.7 g/dL (ref 30.0–36.0)
MCV: 89.6 fL (ref 80.0–100.0)
Platelets: 291 10*3/uL (ref 150–400)
RBC: 2.5 MIL/uL — ABNORMAL LOW (ref 4.22–5.81)
RDW: 16.7 % — ABNORMAL HIGH (ref 11.5–15.5)
WBC: 11.2 10*3/uL — ABNORMAL HIGH (ref 4.0–10.5)
nRBC: 0 % (ref 0.0–0.2)

## 2018-12-16 LAB — HIV ANTIBODY (ROUTINE TESTING W REFLEX): HIV Screen 4th Generation wRfx: NONREACTIVE

## 2018-12-16 LAB — PREPARE RBC (CROSSMATCH)

## 2018-12-16 MED ORDER — HEPARIN SOD (PORK) LOCK FLUSH 100 UNIT/ML IV SOLN: 250.0000 [IU] | INTRAVENOUS | Status: DC | PRN

## 2018-12-16 MED ORDER — ENSURE ENLIVE PO LIQD
237.0000 mL | Freq: Three times a day (TID) | ORAL | Status: DC
  Administered 2018-12-16 – 2018-12-20 (×9): 237 mL via ORAL

## 2018-12-16 MED ORDER — HEPARIN SOD (PORK) LOCK FLUSH 100 UNIT/ML IV SOLN: 500.0000 [IU] | Freq: Every day | INTRAVENOUS | Status: DC | PRN

## 2018-12-16 MED ORDER — CEPHALEXIN 500 MG PO CAPS
500.0000 mg | ORAL_CAPSULE | Freq: Three times a day (TID) | ORAL | Status: DC
  Administered 2018-12-16 – 2018-12-22 (×18): 500 mg via ORAL
  Filled 2018-12-16 (×18): qty 1

## 2018-12-16 MED ORDER — SODIUM CHLORIDE 0.9% FLUSH: 10.0000 mL | INTRAVENOUS | Status: DC | PRN

## 2018-12-16 MED ORDER — SODIUM CHLORIDE 0.9% IV SOLUTION
250.0000 mL | Freq: Once | INTRAVENOUS | Status: AC
  Administered 2018-12-16: 250 mL via INTRAVENOUS

## 2018-12-16 MED ORDER — SODIUM CHLORIDE 0.9% FLUSH: 3.0000 mL | INTRAVENOUS | Status: DC | PRN

## 2018-12-16 NOTE — Progress Notes (Signed)
Initial Nutrition Assessment  DOCUMENTATION CODES:   Severe malnutrition in context of acute illness/injury  INTERVENTION:  Increase to Ensure Enlive po TID, each supplement provides 350 kcal and 20 grams of protein. Encouraged patient to drink 2-3 bottles per day between meals.  Encouraged adequate intake of calories and protein at meals. Discussed focusing on intake of calorie- and protein-dense foods/beverages.  NUTRITION DIAGNOSIS:   Severe Malnutrition related to acute illness(unknown etiology, diarrhea, colovesical fistula, inadequate oral intake x 1 month) as evidenced by moderate fat depletion, moderate-severe muscle depletion, 14.3% weight loss over one month per patient report.  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   -Preliminary biopsy results from retroperitoneal lymph node on 2/17 favors reactive process. -Per Urology note today schistosomiasis is also on differential given travel history.  Met with patient and his mother at bedside. He reports he recently traveled to [place] and [place] in January. He did not like any of the food there so for an entire month he was eating only bananas and apples. He also developed GI upset and diarrhea. He reports that since returning his appetite has improved and he has been eating very well. He typically eats 2 meals per day. He finished 100% of his breakfast this morning. He was about to order lunch at time of RD assessment. Reports yesterday he finished about 50% of his dinner, which was roast beef with mashed potatoes and gravy.  He has not been drinking the Ensure ordered by MD, but after discussing his nutrition status and importance of increasing calorie/protein intake so patient can regain his weight and muscle mass, he is agreeable to drink these between meals.  Patient reports his UBW was around 175 lbs (79.5 kg). He reports losing about 25 lbs over one month, of which he has started to gain some back now. He is currently 70.7 kg (155.87 lbs). That is a significant weight loss of 14.3% body weight in one month, which would still be significant even if it occurred chronically over at least 6 months. Patient reports he has lost a significant amount of his muscle mass, and this is evident from the NFPE (see below). The amount of wasting present truly appears more chronic in nature, but there is a large gap in weight history of >1 year, so unable to tell if weight had possibly started decreasing prior to January.  Meal Completion: 100% of breakfast this morning  Medications reviewed and include: Keflex, Ensure Enlive BID.  Labs reviewed: Hgb 7.1, Hcg 22.4, Iron 30, TIBC 151, Ferritin 1032.  NUTRITION - FOCUSED PHYSICAL EXAM:    Most Recent Value  Orbital Region  Moderate depletion  Upper Arm Region  Severe depletion  Thoracic and Lumbar Region  Moderate depletion  Buccal Region  Moderate depletion  Temple Region  Moderate depletion  Clavicle Bone Region  Severe depletion  Clavicle and Acromion Bone Region  Severe depletion  Scapular Bone Region  Severe depletion  Dorsal Hand  Moderate depletion  Patellar Region  Moderate depletion  Anterior Thigh Region  Moderate depletion  Posterior Calf Region  Severe depletion  Edema (RD Assessment)  None  Hair  Reviewed  Eyes  Reviewed  Mouth  Reviewed  Skin  Reviewed  Nails  Reviewed     Diet Order:   Diet Order            Diet NPO time specified  Diet effective midnight        Diet regular Room service appropriate? Yes; Fluid consistency: Thin  Diet effective now              EDUCATION NEEDS:   Education needs have been addressed  Skin:  Skin Assessment: Reviewed RN Assessment  Last BM:  12/15/2018 per chart  Height:   Ht Readings from Last 1 Encounters:  12/14/18 _0  (1.803 m)   Weight:   Wt Readings from Last 1 Encounters:  12/14/18 70.7 kg   Ideal Body Weight:  78.2 kg  BMI:  Body mass index is 21.74 kg/m.  Estimated Nutritional Needs:   Kcal:  2095-2420 (MSJ x 1.3-1.5)  Protein:  105-120 grams (1.5-1.7 grams/kg)  Fluid:  2.1-2.5 L/day (30-35 mL/kg)  Willey Blade, MS, RD, LDN Office: [phone] Pager: [phone] After Hours/Weekend Pager: [phone]

## 2018-12-16 NOTE — H&P (View-Only) (Signed)
12/16/18  Urology consult follow-up  Continues to receive blood transfusion with rising hemoglobin.  Remains symptomatic from fistula with urinary symptoms.  Prelim pathology from retroperitoneal biopsy date yesterday which was successful favors reactive process.  Further discussion today about his travel history.  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  Retroperitoneal biopsy reassuring.  We discussed options and further diagnostic intervention including proceeding to the operating room tomorrow for cystoscopy, possible bladder biopsy, cystogram to help aid in the diagnosis.  Risks of the procedure in detail including risk of bleeding, infection, damage surrounding structures, worsening of his urinary symptoms, sepsis amongst others.  All questions were answered.  NPO at [place]  Notably, schistosomiasis is in the differential given his travel history and exposure.   Hollice Espy, MD

## 2018-12-16 NOTE — Anesthesia Preprocedure Evaluation (Addendum)
Anesthesia Evaluation  Patient identified by MRN, date of birth, ID band Patient awake    Reviewed: Allergy & Precautions, NPO status , Patient's Chart, lab work & pertinent test results  History of Anesthesia Complications Negative for: history of anesthetic complications  Airway Mallampati: II  TM Distance: >3 FB Neck ROM: Full    Dental no notable dental hx.    Pulmonary neg sleep apnea, neg COPD, former smoker,    breath sounds clear to auscultation- rhonchi (-) wheezing      Cardiovascular Exercise Tolerance: Good (-) hypertension(-) CAD, (-) Past MI, (-) Cardiac Stents and (-) CABG  Rhythm:Regular Rate:Normal - Systolic murmurs and - Diastolic murmurs    Neuro/Psych neg Seizures negative neurological ROS  negative psych ROS   GI/Hepatic negative GI ROS, Neg liver ROS,   Endo/Other    Renal/GU negative Renal ROS     Musculoskeletal negative musculoskeletal ROS (+)   Abdominal (+) - obese,   Peds  Hematology  (+) anemia ,   Anesthesia Other Findings History reviewed. No pertinent past medical history.   Reproductive/Obstetrics                            Anesthesia Physical Anesthesia Plan  ASA: II  Anesthesia Plan: General   Post-op Pain Management:    Induction: Intravenous  PONV Risk Score and Plan: 1 and Ondansetron, Dexamethasone and Midazolam  Airway Management Planned: LMA  Additional Equipment:   Intra-op Plan:   Post-operative Plan:   Informed Consent: I have reviewed the patients History and Physical, chart, labs and discussed the procedure including the risks, benefits and alternatives for the proposed anesthesia with the patient or authorized representative who has indicated his/her understanding and acceptance.     Dental advisory given  Plan Discussed with: CRNA and Anesthesiologist  Anesthesia Plan Comments:         Anesthesia Quick  Evaluation

## 2018-12-16 NOTE — Progress Notes (Signed)
[facility] NAME: Tony Gillespie    MR#:  [id number]  DATE OF BIRTH:  May 05, 1971  SUBJECTIVE:  patient came in with having foul-smelling urine. Weight loss and loss of appetite. This is been going on for several weeks. He just returned from a trip to [place] and [place].  Found to have hemoglobin of 6.7. No family in the room  REVIEW OF SYSTEMS:   Review of Systems  Constitutional: Negative for chills, fever and weight loss.  HENT: Negative for ear discharge, ear pain and nosebleeds.   Eyes: Negative for blurred vision, pain and discharge.  Respiratory: Negative for sputum production, shortness of breath, wheezing and stridor.   Cardiovascular: Negative for chest pain, palpitations, orthopnea and PND.  Gastrointestinal: Negative for abdominal pain, diarrhea, nausea and vomiting.  Genitourinary: Positive for dysuria and hematuria. Negative for frequency and urgency.  Musculoskeletal: Negative for back pain and joint pain.  Neurological: Positive for weakness. Negative for sensory change, speech change and focal weakness.  Psychiatric/Behavioral: Negative for depression and hallucinations. The patient is not nervous/anxious.    Tolerating Diet:yes Tolerating PT:   DRUG ALLERGIES:   Allergies  Allergen Reactions  . Erythromycin Nausea And Vomiting    VITALS:  Blood pressure 103/64, pulse 72, temperature 99.4 F (37.4 C), temperature source Axillary, resp. rate 18, height 5\' 11"  (1.803 m), weight 70.7 kg, SpO2 100 %.  PHYSICAL EXAMINATION:   Physical Exam  GENERAL:  48 y.o.-year-old patient lying in the bed with no acute distress. Pallor + weak, thin cachectic EYES: Pupils equal, round, reactive to light and accommodation. No scleral icterus. Extraocular muscles intact.  HEENT: Head atraumatic, normocephalic. Oropharynx and nasopharynx clear.  NECK:  Supple, no jugular venous distention. No thyroid enlargement, no  tenderness.  LUNGS: Normal breath sounds bilaterally, no wheezing, rales, rhonchi. No use of accessory muscles of respiration.  CARDIOVASCULAR: S1, S2 normal. No murmurs, rubs, or gallops.  ABDOMEN: Soft, nontender, nondistended. Bowel sounds present. No organomegaly or mass.  EXTREMITIES: No cyanosis, clubbing or edema b/l.    NEUROLOGIC: Cranial nerves II through XII are intact. No focal Motor or sensory deficits b/l.   PSYCHIATRIC:  patient is alert and oriented x 3.  SKIN: No obvious rash, lesion, or ulcer.   LABORATORY PANEL:  CBC Recent Labs  Lab 12/16/18 0426  WBC 11.2*  HGB 7.1*  HCT 22.4*  PLT 291    Chemistries  Recent Labs  Lab 12/14/18 0953 12/15/18 0511  NA 132* 136  K 4.3 4.0  CL 102 105  CO2 24 25  GLUCOSE 110* 122*  BUN 10 12  CREATININE 0.97 0.70  CALCIUM 8.2* 7.9*  AST 52*  --   ALT 49*  --   ALKPHOS 242*  --   BILITOT 1.7*  --    Cardiac Enzymes No results for input(s): TROPONINI in the last 168 hours. RADIOLOGY:  Ct Guided Needle Placement  Result Date: 12/15/2018 CLINICAL DATA:  Colovesical fistula, sigmoid colonic mass, portal vein thrombosis and left para-aortic retroperitoneal lymphadenopathy. The patient presents for biopsy of the left para-aortic lymph node in order to try to establish a diagnosis. EXAM: CT GUIDED CORE BIOPSY OF LEFT ABDOMINAL RETROPERITONEAL LYMPH NODE ANESTHESIA/SEDATION: 5.0 mg IV Versed; 100 mcg IV Fentanyl Total Moderate Sedation Time:  19 minutes. The patient's level of consciousness and physiologic status were continuously monitored during the procedure by Radiology nursing. PROCEDURE: The procedure risks, benefits, and alternatives were explained to  the patient. Questions regarding the procedure were encouraged and answered. The patient understands and consents to the procedure. A time-out was performed prior to initiating the procedure. CT was performed in a prone position. The left trans lumbar region was prepped with  chlorhexidine in a sterile fashion, and a sterile drape was applied covering the operative field. A sterile gown and sterile gloves were used for the procedure. Local anesthesia was provided with 1% Lidocaine. Under CT guidance, a 17 gauge trocar needle was advanced to the level of a left para-aortic retroperitoneal lymph node. Three separate coaxial 18 gauge core biopsy samples were obtained. Core samples were submitted in formalin. Additional CT was performed after outer needle removal. COMPLICATIONS: None FINDINGS: Left para-aortic lymph node medial to the mid left kidney was localized. This measures approximately 1.8 x 2.4 cm. Solid tissue was obtained with biopsy. IMPRESSION: CT-guided core biopsy performed of a left para-aortic abdominal retroperitoneal lymph node. Electronically Signed   By: Aletta Edouard M.D.   On: 12/15/2018 16:17   ASSESSMENT AND PLAN:  Tony Gillespie  is a 48 y.o. male with a known history of no medical issues, did not go to a physician for last 2 years and does not take any prescription medicines at home.  For last 1 to 2 months had some stomach virus episodes, with diarrhea.  Since then he had been having on and off episodes of loose stools and stomach upset. For last 1 month he also started noticing dirty looking urine which lately converted having stool and gas coming through the urine.  He also had few episodes where he has blood coming through the urine.  1. Symptomatic anemia with hematuria and foul-smelling urine with abnormal CT scan showing suspected bladder mass and significant: Colovesicle fistula -patient is status post 18gauge core biopsy of 1.8 time 2.4 cm paraaortic retroperitoneal lymph node CT guided-- by Dr. Kathlene Cote -oncology consultation with Dr. Rogue Bussing -urology consultation with Dr. Rayburn Ma appreciated -hemoglobin 6.7--- one unit blood transfusion-- 7.1 -appreciate urology and surgical plan. Dr. Erlene Quan plans to do cystoscopy and  biopsy.  2.  UTI -IV Zosyn--change to po keflex -UC  e coli pansensitive  3. 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 -This was noted on CT scan of the abdomen  4. DVT SCD  Case discussed with Care Management/Social Worker. Management plans discussed with the patient and they are in agreement.  CODE STATUS: full code  DVT Prophylaxis: SCD  TOTAL TIME TAKING CARE OF THIS PATIENT: *30* minutes.  >50% time spent on counselling and coordination of care  POSSIBLE D/C IN *2-3* DAYS, DEPENDING ON CLINICAL CONDITION.  Note: This dictation was prepared with Dragon dictation along with smaller phrase technology. Any transcriptional errors that result from this process are unintentional.  Fritzi Mandes M.D on 12/16/2018 at 2:17 PM  Between 7am to 6pm - Pager - [phone]  After 6pm go to [email or web address] - password [email or web address] [facility]  Office  [phone]  CC: Primary care physician; Patient, No Pcp PerPatient ID: Tony Gillespie, male   DOB: Jun 06, 1971, 48 y.o.   MRN: [id number]

## 2018-12-16 NOTE — Progress Notes (Signed)
12/16/18  Urology consult follow-up  Continues to receive blood transfusion with rising hemoglobin.  Remains symptomatic from fistula with urinary symptoms.  Prelim pathology from retroperitoneal biopsy date yesterday which was successful favors reactive process.  Further discussion today about his travel history.  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  Retroperitoneal biopsy reassuring.  We discussed options and further diagnostic intervention including proceeding to the operating room tomorrow for cystoscopy, possible bladder biopsy, cystogram to help aid in the diagnosis.  Risks of the procedure in detail including risk of bleeding, infection, damage surrounding structures, worsening of his urinary symptoms, sepsis amongst others.  All questions were answered.  NPO at [place]  Notably, schistosomiasis is in the differential given his travel history and exposure.   Hollice Espy, MD

## 2018-12-16 NOTE — Progress Notes (Signed)
Tony Gillespie   DOB:1971-02-26   EZ#:[id number]    Subjective: Denies having fevers last night no chills.  Positive for night sweats.  No nausea no vomiting.  Continues to pass feces in the urine.  Pain while urination.  Objective:  Vitals:   12/15/18 2051 12/16/18 0421  BP: 105/70 102/68  Pulse: 86 77  Resp: 14 16  Temp: 98.8 F (37.1 C) 98.2 F (36.8 C)  SpO2: 98% 98%     Intake/Output Summary (Last 24 hours) at 12/16/2018 6503 Last data filed at 12/15/2018 1619 Gross per 24 hour  Intake 300 ml  Output -  Net 300 ml    Physical Exam  Constitutional: He is oriented to person, place, and time.  Thin built male patient resting in the bed comfortably.  He is walking the hallways.  HENT:  Head: Normocephalic and atraumatic.  Mouth/Throat: Oropharynx is clear and moist. No oropharyngeal exudate.  Eyes: Pupils are equal, round, and reactive to light.  Neck: Normal range of motion. Neck supple.  Cardiovascular: Normal rate and regular rhythm.  Pulmonary/Chest: Breath sounds normal. No respiratory distress. He has no wheezes.  Abdominal: Soft. Bowel sounds are normal. He exhibits no distension and no mass. There is no abdominal tenderness. There is no rebound and no guarding.  Musculoskeletal: Normal range of motion.        General: No tenderness or edema.  Neurological: He is alert and oriented to person, place, and time.  Skin: Skin is warm.  Psychiatric: Affect normal.     Labs:  Lab Results  Component Value Date   WBC 11.2 (H) 12/16/2018   HGB 7.1 (L) 12/16/2018   HCT 22.4 (L) 12/16/2018   MCV 89.6 12/16/2018   PLT 291 12/16/2018   NEUTROABS 14.7 (H) 12/14/2018    Lab Results  Component Value Date   NA 136 12/15/2018   K 4.0 12/15/2018   CL 105 12/15/2018   CO2 25 12/15/2018    Studies:  Ct Guided Needle Placement  Result Date: 12/15/2018 CLINICAL DATA:  Colovesical fistula, sigmoid colonic mass, portal vein thrombosis and left para-aortic retroperitoneal  lymphadenopathy. The patient presents for biopsy of the left para-aortic lymph node in order to try to establish a diagnosis. EXAM: CT GUIDED CORE BIOPSY OF LEFT ABDOMINAL RETROPERITONEAL LYMPH NODE ANESTHESIA/SEDATION: 5.0 mg IV Versed; 100 mcg IV Fentanyl Total Moderate Sedation Time:  19 minutes. The patient's level of consciousness and physiologic status were continuously monitored during the procedure by Radiology nursing. PROCEDURE: The procedure risks, benefits, and alternatives were explained to the patient. Questions regarding the procedure were encouraged and answered. The patient understands and consents to the procedure. A time-out was performed prior to initiating the procedure. CT was performed in a prone position. The left trans lumbar region was prepped with chlorhexidine in a sterile fashion, and a sterile drape was applied covering the operative field. A sterile gown and sterile gloves were used for the procedure. Local anesthesia was provided with 1% Lidocaine. Under CT guidance, a 17 gauge trocar needle was advanced to the level of a left para-aortic retroperitoneal lymph node. Three separate coaxial 18 gauge core biopsy samples were obtained. Core samples were submitted in formalin. Additional CT was performed after outer needle removal. COMPLICATIONS: None FINDINGS: Left para-aortic lymph node medial to the mid left kidney was localized. This measures approximately 1.8 x 2.4 cm. Solid tissue was obtained with biopsy. IMPRESSION: CT-guided core biopsy performed of a left para-aortic abdominal retroperitoneal lymph node. Electronically  Signed   By: Aletta Edouard M.D.   On: 12/15/2018 16:17   Ct Abdomen Pelvis W Contrast  Result Date: 12/14/2018 CLINICAL DATA:  Fecal matter in urine for 3 days. Evaluate for fistula. EXAM: CT ABDOMEN AND PELVIS WITH CONTRAST TECHNIQUE: Multidetector CT imaging of the abdomen and pelvis was performed using the standard protocol following bolus administration  of intravenous contrast. CONTRAST:  155mL OMNIPAQUE IOHEXOL 300 MG/ML SOLN, 68mL ISOVUE-300 IOPAMIDOL (ISOVUE-300) INJECTION 61% COMPARISON:  None. FINDINGS: Lower chest: Mild dependent atelectasis and tiny pleural effusions. The lower chest is otherwise normal. Hepatobiliary: There is branching low-attenuation in the liver which appears to primarily represent thrombosed left portal vein, thrombosed anterior branch of the right portal vein, and thrombosis of the more peripheral posterior branch of the portal vein. The main portal vein and the extra hepatic portions of the portal vein are well opacified. It would be difficult to exclude some associated intraductal dilatation. The gallbladder is not distended. No definitive mass in the porta hepatis. There is cavernous transformation in the anterior aspect of the porta hepatis. Pancreas: Unremarkable. No pancreatic ductal dilatation or surrounding inflammatory changes. Spleen: There is low-attenuation in the medial aspect of the spleen on series 2, image 19. Few other regions of low attenuation, subtle and small, are seen on image 18. The spleen measures 13.5 cm in cranial caudal dimension, mildly enlarged. Adrenals/Urinary Tract: The adrenal glands and kidneys are unremarkable. The ureters are normal. There is high attenuation in the posterior bladder on image 85, likely tiny stones. There is air in the anterior bladder. There is a collection of soft tissue, debris, fluid, and air between the anterior left dome of the bladder and the sigmoid colon. This collection extends superiorly as seen on image 72 of series 2. The collection exerts mass effect on the top of the bladder. There also appears to be some soft tissue nodularity associated with the bladder in this region. This abnormality is well seen on sagittal image 62 and appears to measure 8.2 x 4.7 by 4.6 cm in cranial caudal, anterior-posterior, and transverse dimensions. Stomach/Bowel: The stomach and small  bowel are normal. There is thickening in the wall of the sigmoid colon in addition to diverticuli. The remainder of the colon is unremarkable. The appendix is normal. Vascular/Lymphatic: There is atherosclerotic change in the nonaneurysmal aorta and iliac vessels. There is an enlarged portacaval vein measuring 2 cm on axial image 32. There is retroperitoneal adenopathy as well with a left para or aortic node measuring 2 cm on image 41 of series 2. No definite adenopathy within the pelvis. Reproductive: Prostate is unremarkable. Other: There is ascites in the abdomen and pelvis. There is increased attenuation in the subcutaneous and intra-abdominal fat consistent with volume overload. Musculoskeletal: No bony metastatic disease noted. IMPRESSION: 1. There is thrombosis of the left portal vein, the anterior branch of the right portal vein, and the more peripheral aspect of the right portal vein posterior branch. The extrahepatic portions of the portal vein are well opacified. It would be difficult to exclude mild intrahepatic ductal dilatation although no extrahepatic ductal dilatation is noted. An underlying cause for the portal vein thrombosis is not seen on this study. No definitive mass in the porta hepatis or pancreas identified. 2. There is a collection of air, debris, and soft tissue thickening between the sigmoid colon and the bladder dome. This explains the patient's symptoms. However, it is unclear whether the abnormality arises from the bladder given the nodular thickening  of the bladder wall or arises from the sigmoid colon. A bladder carcinoma invading adjacent colon should be considered. The linear extension of air in the collection along the expected course of the urachal remnant raises the possibility of an adenocarcinoma forming in the urachus involving the bladder and adjacent sigmoid colon. A fistulous connection between the colon and bladder due to colonic diverticular disease or a colonic neoplasm  is possible as well. 3. Adenopathy in the retroperitoneal region and porta hepatis could be reactive or metastatic. 4. Mild splenomegaly. The low-attenuation in the periphery of the spleen medially may represent infarct. 5. Ascites, likely due to the liver abnormalities. Findings called to Dr. Alfred Levins. 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  Patient status post CT-guided biopsy of the retroperitoneal lymph node.  Await pathology.  Also await plan from urology with regards to colovesical fistula.  #Severe anemia hemoglobin 6.1-question blood loss/anemia of chronic disease-iron studies suggestive of anemia of chronic disease.  Status post PRBC transfusion; hemoglobin 7.1.  Proceed with 1 more unit of transfusion today.  This is been ordered.  #Portal vein thrombosis-asymptomatic at this time.  Likely related to underlying malignancy/hold off anticoagulation at this time as patient is severely anemic/GU/GI bleeding.  # FHx of cancers- dad- rcc/ uncles [dad's 3 brothers "all had cancers"-1 uncle had prostate prostate; unsure about other uncles.  Denies any stomach or colon cancers of brain cancer in the family.  Patient will likely need genetic testing.  #The above plan of care was discussed with the patient in detail.  He states his mother has been updated he is keeping his mother updated of his health issues.   Cammie Sickle, MD 12/16/2018  8:24 AM

## 2018-12-16 NOTE — Progress Notes (Signed)
Subjective:  CC:  Tony Gillespie is a 48 y.o. male  Hospital stay day 2,   colovesicular fistula, unknown etiology  HPI: No acute issues overnight.  ROS:  General: Denies weight loss, weight gain, fatigue, fevers, chills, and night sweats. Heart: Denies chest pain, palpitations, racing heart, irregular heartbeat, leg pain or swelling, and decreased activity tolerance. Respiratory: Denies breathing difficulty, shortness of breath, wheezing, cough, and sputum. GI: Denies change in appetite, heartburn, nausea, vomiting, constipation, diarrhea, and blood in stool.   Objective:      Temp:  [98.2 F (36.8 C)-99.4 F (37.4 C)] 99.3 F (37.4 C) (02/18 1529) Pulse Rate:  [72-86] 75 (02/18 1529) Resp:  [14-20] 18 (02/18 1529) BP: (97-105)/(64-70) 102/67 (02/18 1529) SpO2:  [97 %-100 %] 97 % (02/18 1529)     Height: 5\' 11"  (180.3 cm) Weight: 70.7 kg BMI (Calculated): 21.75   Intake/Output this shift:   Intake/Output Summary (Last 24 hours) at 12/16/2018 1551 Last data filed at 12/16/2018 1531 Gross per 24 hour  Intake 870 ml  Output -  Net 870 ml     Constitutional :  alert, cooperative, appears stated age and no distress  Respiratory:  clear to auscultation bilaterally  Cardiovascular:  regular rate and rhythm  Gastrointestinal: soft, non-tender; bowel sounds normal; no masses,  no organomegaly.   Skin: Cool and moist.   Psychiatric: Normal affect, non-agitated, not confused       LABS:  CMP Latest Ref Rng & Units 12/15/2018 12/14/2018  Glucose 70 - 99 mg/dL 122(H) 110(H)  BUN 6 - 20 mg/dL 12 10  Creatinine 0.61 - 1.24 mg/dL 0.70 0.97  Sodium 135 - 145 mmol/L 136 132(L)  Potassium 3.5 - 5.1 mmol/L 4.0 4.3  Chloride 98 - 111 mmol/L 105 102  CO2 22 - 32 mmol/L 25 24  Calcium 8.9 - 10.3 mg/dL 7.9(L) 8.2(L)  Total Protein 6.5 - 8.1 g/dL - 6.8  Total Bilirubin 0.3 - 1.2 mg/dL - 1.7(H)  Alkaline Phos 38 - 126 U/L - 242(H)  AST 15 - 41 U/L - 52(H)  ALT 0 - 44 U/L - 49(H)    CBC Latest Ref Rng & Units 12/16/2018 12/15/2018 12/14/2018  WBC 4.0 - 10.5 K/uL 11.2(H) 11.1(H) 16.8(H)  Hemoglobin 13.0 - 17.0 g/dL 7.1(L) 6.1(L) 7.7(L)  Hematocrit 39.0 - 52.0 % 22.4(L) 19.1(L) 24.1(L)  Platelets 150 - 400 K/uL 291 256 322    RADS: IR biopsy of retroperitoneal LN, negative for malignancy per verbal report. Assessment:   colovesicular fistula, unknown etiology.  LN negative for malignancy.  Uro to take to cystoscopy for further workup.  Final surgical approach and the timing of when to perform will largely depend on if this is malignancy vs inflammatory process, vs possible infectious Process.  Will continue to follow

## 2018-12-17 ENCOUNTER — Inpatient Hospital Stay: Payer: Self-pay | Admitting: Anesthesiology

## 2018-12-17 ENCOUNTER — Encounter
Admission: EM | Disposition: A | Discharge status: Home / Self Care | Payer: Self-pay | Source: Home / Self Care | Attending: Internal Medicine

## 2018-12-17 ENCOUNTER — Encounter: Payer: Self-pay | Admitting: *Deleted

## 2018-12-17 DIAGNOSIS — N3289 Other specified disorders of bladder: Secondary | ICD-10-CM

## 2018-12-17 DIAGNOSIS — B9689 Other specified bacterial agents as the cause of diseases classified elsewhere: Secondary | ICD-10-CM

## 2018-12-17 DIAGNOSIS — E43 Unspecified severe protein-calorie malnutrition: Secondary | ICD-10-CM

## 2018-12-17 DIAGNOSIS — R590 Localized enlarged lymph nodes: Secondary | ICD-10-CM

## 2018-12-17 DIAGNOSIS — N321 Vesicointestinal fistula: Secondary | ICD-10-CM

## 2018-12-17 HISTORY — PX: CYSTOGRAM: SHX6285

## 2018-12-17 HISTORY — PX: CYSTOSCOPY WITH BIOPSY: SHX5122

## 2018-12-17 LAB — TYPE AND SCREEN
ABO/RH(D): O POS
Antibody Screen: NEGATIVE
Unit division: 0
Unit division: 0

## 2018-12-17 LAB — CBC
HCT: 25.7 % — ABNORMAL LOW (ref 39.0–52.0)
Hemoglobin: 8 g/dL — ABNORMAL LOW (ref 13.0–17.0)
MCH: 28.6 pg (ref 26.0–34.0)
MCHC: 31.1 g/dL (ref 30.0–36.0)
MCV: 91.8 fL (ref 80.0–100.0)
Platelets: 301 10*3/uL (ref 150–400)
RBC: 2.8 MIL/uL — ABNORMAL LOW (ref 4.22–5.81)
RDW: 16.6 % — ABNORMAL HIGH (ref 11.5–15.5)
WBC: 10.7 10*3/uL — ABNORMAL HIGH (ref 4.0–10.5)
nRBC: 0 % (ref 0.0–0.2)

## 2018-12-17 LAB — BPAM RBC
BLOOD PRODUCT EXPIRATION DATE: 202003082359
Blood Product Expiration Date: 202003082359
ISSUE DATE / TIME: 202002171351
ISSUE DATE / TIME: 202002181149
Unit Type and Rh: 5100
Unit Type and Rh: 5100

## 2018-12-17 LAB — SURGICAL PATHOLOGY

## 2018-12-17 SURGERY — CYSTOGRAM
Anesthesia: General | Site: Bladder

## 2018-12-17 MED ORDER — OXYCODONE HCL 5 MG/5ML PO SOLN: 5.0000 mg | Freq: Once | ORAL | Status: DC | PRN

## 2018-12-17 MED ORDER — PROMETHAZINE HCL 25 MG/ML IJ SOLN: 6.2500 mg | INTRAMUSCULAR | Status: DC | PRN

## 2018-12-17 MED ORDER — OXYCODONE HCL 5 MG PO TABS
5.0000 mg | ORAL_TABLET | ORAL | Status: DC | PRN
  Administered 2018-12-17 – 2018-12-20 (×11): 5 mg via ORAL
  Filled 2018-12-17 (×12): qty 1

## 2018-12-17 MED ORDER — IOPAMIDOL (ISOVUE-200) INJECTION 41%
INTRAVENOUS | Status: DC | PRN
  Administered 2018-12-17: 40 mL

## 2018-12-17 MED ORDER — OXYCODONE HCL 5 MG PO TABS: 5.0000 mg | ORAL_TABLET | Freq: Once | ORAL | Status: DC | PRN

## 2018-12-17 MED ORDER — PIPERACILLIN-TAZOBACTAM 3.375 G IVPB
INTRAVENOUS | Status: AC
  Administered 2018-12-17: 16:00:00
  Filled 2018-12-17: qty 50

## 2018-12-17 MED ORDER — FENTANYL CITRATE (PF) 100 MCG/2ML IJ SOLN
INTRAMUSCULAR | Status: DC | PRN
  Administered 2018-12-17 (×2): 50 ug via INTRAVENOUS

## 2018-12-17 MED ORDER — MIDAZOLAM HCL 2 MG/2ML IJ SOLN
INTRAMUSCULAR | Status: AC
  Filled 2018-12-17: qty 2

## 2018-12-17 MED ORDER — PROPOFOL 10 MG/ML IV BOLUS
INTRAVENOUS | Status: DC | PRN
  Administered 2018-12-17: 150 mg via INTRAVENOUS

## 2018-12-17 MED ORDER — MIDAZOLAM HCL 2 MG/2ML IJ SOLN
INTRAMUSCULAR | Status: DC | PRN
  Administered 2018-12-17: 2 mg via INTRAVENOUS

## 2018-12-17 MED ORDER — FENTANYL CITRATE (PF) 100 MCG/2ML IJ SOLN
INTRAMUSCULAR | Status: AC
  Filled 2018-12-17: qty 2

## 2018-12-17 MED ORDER — PROPOFOL 10 MG/ML IV BOLUS
INTRAVENOUS | Status: AC
  Filled 2018-12-17: qty 20

## 2018-12-17 MED ORDER — MORPHINE SULFATE (PF) 2 MG/ML IV SOLN
2.0000 mg | INTRAVENOUS | Status: DC | PRN
  Administered 2018-12-17 (×2): 3 mg via INTRAVENOUS
  Administered 2018-12-17: 18:00:00 2 mg via INTRAVENOUS
  Filled 2018-12-17 (×3): qty 2

## 2018-12-17 MED ORDER — HYDROMORPHONE HCL 1 MG/ML IJ SOLN
0.5000 mg | INTRAMUSCULAR | Status: DC | PRN
  Administered 2018-12-17 – 2018-12-18 (×2): 0.5 mg via INTRAVENOUS
  Filled 2018-12-17 (×2): qty 1

## 2018-12-17 MED ORDER — LACTATED RINGERS IV SOLN
INTRAVENOUS | Status: DC | PRN
  Administered 2018-12-17: 15:00:00 via INTRAVENOUS

## 2018-12-17 MED ORDER — LIDOCAINE HCL (CARDIAC) PF 100 MG/5ML IV SOSY
PREFILLED_SYRINGE | INTRAVENOUS | Status: DC | PRN
  Administered 2018-12-17: 60 mg via INTRAVENOUS

## 2018-12-17 MED ORDER — MORPHINE SULFATE (PF) 2 MG/ML IV SOLN
2.0000 mg | INTRAVENOUS | Status: DC | PRN
  Administered 2018-12-17 (×2): 2 mg via INTRAVENOUS
  Filled 2018-12-17 (×2): qty 1

## 2018-12-17 MED ORDER — MEPERIDINE HCL 50 MG/ML IJ SOLN: 6.2500 mg | INTRAMUSCULAR | Status: DC | PRN

## 2018-12-17 MED ORDER — FENTANYL CITRATE (PF) 100 MCG/2ML IJ SOLN: 25.0000 ug | INTRAMUSCULAR | Status: DC | PRN

## 2018-12-17 SURGICAL SUPPLY — 23 items
BAG DRAIN CYSTO-URO LG1000N (MISCELLANEOUS) ×3 IMPLANT
BRUSH SCRUB EZ  4% CHG (MISCELLANEOUS) ×2
BRUSH SCRUB EZ 4% CHG (MISCELLANEOUS) ×1 IMPLANT
COVER WAND RF STERILE (DRAPES) ×1 IMPLANT
DRAPE UTILITY 15X26 TOWEL STRL (DRAPES) ×3 IMPLANT
DRSG TELFA 4X3 1S NADH ST (GAUZE/BANDAGES/DRESSINGS) ×3 IMPLANT
ELECT REM PT RETURN 9FT ADLT (ELECTROSURGICAL) ×3
ELECTRODE REM PT RTRN 9FT ADLT (ELECTROSURGICAL) ×1 IMPLANT
GLOVE BIO SURGEON STRL SZ 6.5 (GLOVE) ×3 IMPLANT
GLOVE BIO SURGEONS STRL SZ 6.5 (GLOVE) ×2
GOWN STRL REUS W/ TWL LRG LVL3 (GOWN DISPOSABLE) ×2 IMPLANT
GOWN STRL REUS W/ TWL LRG LVL4 (GOWN DISPOSABLE) ×2 IMPLANT
GOWN STRL REUS W/TWL LRG LVL3 (GOWN DISPOSABLE) ×2
GOWN STRL REUS W/TWL LRG LVL4 (GOWN DISPOSABLE) ×2
KIT TURNOVER CYSTO (KITS) ×3 IMPLANT
NDL SAFETY ECLIPSE 18X1.5 (NEEDLE) ×1 IMPLANT
NEEDLE HYPO 18GX1.5 SHARP (NEEDLE) ×2
PACK CYSTO AR (MISCELLANEOUS) ×3 IMPLANT
SET CYSTO W/LG BORE CLAMP LF (SET/KITS/TRAYS/PACK) ×3 IMPLANT
SURGILUBE 2OZ TUBE FLIPTOP (MISCELLANEOUS) ×3 IMPLANT
SYRINGE IRR TOOMEY STRL 70CC (SYRINGE) ×3 IMPLANT
WATER STERILE IRR 1000ML POUR (IV SOLUTION) ×3 IMPLANT
WATER STERILE IRR 3000ML UROMA (IV SOLUTION) ×3 IMPLANT

## 2018-12-17 NOTE — Anesthesia Procedure Notes (Signed)
Procedure Name: LMA Insertion Date/Time: 12/17/2018 3:10 PM Performed by: Doreen Salvage, CRNA Pre-anesthesia Checklist: Patient identified, Patient being monitored, Timeout performed, Emergency Drugs available and Suction available Patient Re-evaluated:Patient Re-evaluated prior to induction Oxygen Delivery Method: Circle system utilized Preoxygenation: Pre-oxygenation with 100% oxygen Induction Type: IV induction Ventilation: Mask ventilation without difficulty LMA: LMA inserted LMA Size: 3.0 Tube type: Oral Number of attempts: 1 Placement Confirmation: positive ETCO2 and breath sounds checked- equal and bilateral Tube secured with: Tape Dental Injury: Teeth and Oropharynx as per pre-operative assessment

## 2018-12-17 NOTE — Op Note (Signed)
Date of procedure: 12/17/18  Preoperative diagnosis:  1. Colovesical fistula 2. Possible bladder mass  Postoperative diagnosis:  1. Same as above  Procedure: 1. Cystoscopy 2. Cystogram 3. Bladder biopsy  Surgeon: Hollice Espy, MD  Anesthesia: General  Complications: None  Intraoperative findings: Feculent material within the bladder including potato scan.  Abnormal area including the anterior and dome of bladder, raised and bullous edema type changes with almost glandular-like appearance.  No obvious papillary tumor appreciated.  Remainder of the bladder was spared other than nonspecific erythema likely from reactive cystitis.  EBL: Minimal  Specimens: Bladder wall mass  Drains: None  Indication: Tony Gillespie is a 48 y.o. patient with significant colovesical fistula as well as retroperitoneal adenopathy concerning for possible underlying malignancy.  He previously underwent retroperitoneal lymph node biopsy which was most consistent with a reactive process. After reviewing the management options for treatment, he elected to proceed with the above surgical procedure(s). We have discussed the potential benefits and risks of the procedure, side effects of the proposed treatment, the likelihood of the patient achieving the goals of the procedure, and any potential problems that might occur during the procedure or recuperation. Informed consent has been obtained.  Description of procedure:  The patient was taken to the operating room and general anesthesia was induced.  The patient was placed in the dorsal lithotomy position, prepped and draped in the usual sterile fashion, and preoperative antibiotics were administered. A preoperative time-out was performed.   A 21 French scope was advanced per urethra into the bladder.  Initially, there was significant amount of abnormal feculent debris in the bladder which had to be irrigated.  This included what appeared to be a piece of potato  skin measuring 1 cm x 1 cm diagnostic of a fairly significant colovesical fistula.  Upon inspection of the bladder, the dome and anterior bladder wall appeared to be significantly abnormal with a raised like area, slightly hyperemic and bullous in appearance.  There are certain areas which appear to be more suspicious for mass/tumor which had almost a glandular appearance.  I did not have the traditional appearance of urothelial carcinoma without areas of papillary change.  In the center of this mass area which was greater than 5 cm, there was a small amount of adherent debris, presumably the location of the fistulous tract.  At this point time, cystogram was performed by instilling diluted Conray solution into the bladder, approximately 350 cc.  The bladder contour was regular other than a small flatten ed area at the dome consistent with area of known mass.  There was no obvious contrast extravasation noted.  On post drainage films, there was no obvious contrast material extruding into the rectum or colon.  Finally, cold cup biopsy forceps were used to take 5 generous biopsies from the abnormal area which were passed off the field as bladder mass.  Bugbee electrocautery was then used to chief adequate hemostasis.  I elected not to place a catheter due to my concern for becoming obstructed with feculent material.  The patient was then clean and dry, repositioned supine position, reversed anesthesia, taken the PACU in stable condition.  Plan: Follow-up pathology.  Dr. Lysle Pearl was present for portion of the procedure was able to directly visualize the mass as well.  Hollice Espy, M.D.

## 2018-12-17 NOTE — Interval H&P Note (Signed)
History and Physical Interval Note:  12/17/2018 2:47 PM  Tony Gillespie  has presented today for surgery, with the diagnosis of n/a  The various methods of treatment have been discussed with the patient and family. After consideration of risks, benefits and other options for treatment, the patient has consented to  Procedure(s): CYSTOGRAM (N/A) CYSTOSCOPY WITH BLADDER  BIOPSY (N/A) as a surgical intervention .  The patient's history has been reviewed, patient examined, no change in status, stable for surgery.  I have reviewed the patient's chart and labs.  Questions were answered to the patient's satisfaction.     Hollice Espy

## 2018-12-17 NOTE — Transfer of Care (Addendum)
Immediate Anesthesia Transfer of Care Note  Patient: Tony Gillespie  Procedure(s) Performed: Procedure(s): CYSTOGRAM (N/A) CYSTOSCOPY WITH BLADDER  BIOPSY (N/A)  Patient Location: PACU  Anesthesia Type:General  Level of Consciousness: sedated  Airway & Oxygen Therapy: Patient Spontanous Breathing and Patient connected to face mask oxygen  Post-op Assessment: Report given to RN and Post -op Vital signs reviewed and stable  Post vital signs: Reviewed and stable  Last Vitals:  Vitals:   12/17/18 1413 12/17/18 1548  BP: 114/78 (!) 85/63  Pulse: 69 72  Resp: 16 16  Temp: 37.8 C 37.1 C  SpO2: 91% 02%    Complications: No apparent anesthesia complications

## 2018-12-17 NOTE — Progress Notes (Signed)
[facility] [place] NAME: Tony Gillespie    MR#:  [id number]  DATE OF BIRTH:  16-Jan-1971  SUBJECTIVE:  continues with passing fecal material in his urine. Complaining of pain. Ambulating in the hallways. No fever.  REVIEW OF SYSTEMS:   Review of Systems  Constitutional: Negative for chills, fever and weight loss.  HENT: Negative for ear discharge, ear pain and nosebleeds.   Eyes: Negative for blurred vision, pain and discharge.  Respiratory: Negative for sputum production, shortness of breath, wheezing and stridor.   Cardiovascular: Negative for chest pain, palpitations, orthopnea and PND.  Gastrointestinal: Negative for abdominal pain, diarrhea, nausea and vomiting.  Genitourinary: Positive for dysuria and hematuria. Negative for frequency and urgency.  Musculoskeletal: Negative for back pain and joint pain.  Neurological: Positive for weakness. Negative for sensory change, speech change and focal weakness.  Psychiatric/Behavioral: Negative for depression and hallucinations. The patient is not nervous/anxious.    Tolerating Diet: NPO Tolerating PT: ambulating in the hallway  DRUG ALLERGIES:   Allergies  Allergen Reactions  . Erythromycin Nausea And Vomiting    VITALS:  Blood pressure 109/68, pulse 77, temperature 98.2 F (36.8 C), temperature source Oral, resp. rate 15, height 5\' 11"  (1.803 m), weight 70.7 kg, SpO2 100 %.  PHYSICAL EXAMINATION:   Physical Exam  GENERAL:  48 y.o.-year-old patient lying in the bed with no acute distress. Pallor + weak, thin cachectic EYES: Pupils equal, round, reactive to light and accommodation. No scleral icterus. Extraocular muscles intact.  HEENT: Head atraumatic, normocephalic. Oropharynx and nasopharynx clear.  NECK:  Supple, no jugular venous distention. No thyroid enlargement, no tenderness.  LUNGS: Normal breath sounds bilaterally, no wheezing, rales, rhonchi. No use of accessory  muscles of respiration.  CARDIOVASCULAR: S1, S2 normal. No murmurs, rubs, or gallops.  ABDOMEN: Soft, nontender, nondistended. Bowel sounds present. No organomegaly or mass.  EXTREMITIES: No cyanosis, clubbing or edema b/l.    NEUROLOGIC: Cranial nerves II through XII are intact. No focal Motor or sensory deficits b/l.   PSYCHIATRIC:  patient is alert and oriented x 3.  SKIN: No obvious rash, lesion, or ulcer.   LABORATORY PANEL:  CBC Recent Labs  Lab 12/17/18 0510  WBC 10.7*  HGB 8.0*  HCT 25.7*  PLT 301    Chemistries  Recent Labs  Lab 12/14/18 0953 12/15/18 0511  NA 132* 136  K 4.3 4.0  CL 102 105  CO2 24 25  GLUCOSE 110* 122*  BUN 10 12  CREATININE 0.97 0.70  CALCIUM 8.2* 7.9*  AST 52*  --   ALT 49*  --   ALKPHOS 242*  --   BILITOT 1.7*  --    Cardiac Enzymes No results for input(s): TROPONINI in the last 168 hours. RADIOLOGY:  Ct Guided Needle Placement  Result Date: 12/15/2018 CLINICAL DATA:  Colovesical fistula, sigmoid colonic mass, portal vein thrombosis and left para-aortic retroperitoneal lymphadenopathy. The patient presents for biopsy of the left para-aortic lymph node in order to try to establish a diagnosis. EXAM: CT GUIDED CORE BIOPSY OF LEFT ABDOMINAL RETROPERITONEAL LYMPH NODE ANESTHESIA/SEDATION: 5.0 mg IV Versed; 100 mcg IV Fentanyl Total Moderate Sedation Time:  19 minutes. The patient's level of consciousness and physiologic status were continuously monitored during the procedure by Radiology nursing. PROCEDURE: The procedure risks, benefits, and alternatives were explained to the patient. Questions regarding the procedure were encouraged and answered. The patient understands and consents to the procedure. A time-out was performed  prior to initiating the procedure. CT was performed in a prone position. The left trans lumbar region was prepped with chlorhexidine in a sterile fashion, and a sterile drape was applied covering the operative field. A sterile  gown and sterile gloves were used for the procedure. Local anesthesia was provided with 1% Lidocaine. Under CT guidance, a 17 gauge trocar needle was advanced to the level of a left para-aortic retroperitoneal lymph node. Three separate coaxial 18 gauge core biopsy samples were obtained. Core samples were submitted in formalin. Additional CT was performed after outer needle removal. COMPLICATIONS: None FINDINGS: Left para-aortic lymph node medial to the mid left kidney was localized. This measures approximately 1.8 x 2.4 cm. Solid tissue was obtained with biopsy. IMPRESSION: CT-guided core biopsy performed of a left para-aortic abdominal retroperitoneal lymph node. Electronically Signed   By: Aletta Edouard M.D.   On: 12/15/2018 16:17   ASSESSMENT AND PLAN:  Tony Gillespie  is a 48 y.o. male with a known history of no medical issues, did not go to a physician for last 2 years and does not take any prescription medicines at home. For last 1 month he also started noticing dirty looking urine which lately converted having stool and gas coming through the urine.  He also had few episodes where he has blood coming through the urine.  1. Symptomatic anemia with hematuria and foul-smelling urine with abnormal CT scan showing suspected bladder mass vs colon mass and Colovesicle fistula -patient is status post core biopsy of  2.4 cm paraaortic retroperitoneal lymph node CT guided-- by Dr. Bary Leriche biopsy results inconclusive -oncology consultation with Dr. Rogue Bussing -hemoglobin 6.7--- 2 unit blood transfusion-- 7.1--8.0 -appreciate urology and surgical plan. Dr. Erlene Quan plans to do cystoscopy and  biopsy.  2. UTI -IV Zosyn--change to po keflex -UC  e coli pansensitive  3. 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 -This was noted on CT scan of the abdomen  4. DVT SCD  Management plans discussed with the patient and they are in agreement.  CODE STATUS: full code  DVT Prophylaxis:  SCD  TOTAL TIME TAKING CARE OF THIS PATIENT: *30* minutes.  >50% time spent on counselling and coordination of care  POSSIBLE D/C IN *2-3* DAYS, DEPENDING ON CLINICAL CONDITION.  Note: This dictation was prepared with Dragon dictation along with smaller phrase technology. Any transcriptional errors that result from this process are unintentional.  Fritzi Mandes M.D on 12/17/2018 at 8:38 AM  Between 7am to 6pm - Pager - [phone]  After 6pm go to [email or web address] - password [email or web address] [facility]  Office  [phone]  CC: Primary care physician; Patient, No Pcp PerPatient ID: Tony Gillespie, male   DOB: 02-Nov-1970, 48 y.o.   MRN: [id number]

## 2018-12-17 NOTE — Anesthesia Postprocedure Evaluation (Signed)
Anesthesia Post Note  Patient: DELVONTE BERENSON  Procedure(s) Performed: CYSTOGRAM (N/A Bladder) CYSTOSCOPY WITH BLADDER  BIOPSY (N/A Bladder)  Patient location during evaluation: PACU Anesthesia Type: General Level of consciousness: awake and alert Pain management: pain level controlled Vital Signs Assessment: post-procedure vital signs reviewed and stable Respiratory status: spontaneous breathing, nonlabored ventilation, respiratory function stable and patient connected to nasal cannula oxygen Cardiovascular status: blood pressure returned to baseline and stable Postop Assessment: no apparent nausea or vomiting Anesthetic complications: no     Last Vitals:  Vitals:   12/17/18 1653 12/17/18 1718  BP: 99/69 100/68  Pulse: 74 72  Resp:  18  Temp:  36.6 C  SpO2:  100%    Last Pain:  Vitals:   12/17/18 1744  TempSrc:   PainSc: [place]

## 2018-12-17 NOTE — Anesthesia Post-op Follow-up Note (Signed)
Anesthesia QCDR form completed.        

## 2018-12-17 NOTE — Progress Notes (Signed)
Konrad Felix   DOB:05/23/1971   ZM#:[id number]    Subjective: Patient denies having fevers/severe chills.  However had episodes of sweating yesterday.  Noted to have slightly worsened abdominal discomfort suprapubic area.  Patient had PRBC transfusion yesterday uneventful.  Objective:  Vitals:   12/17/18 0402 12/17/18 0814  BP: 117/76 109/68  Pulse: 83 77  Resp: 15   Temp: 99.3 F (37.4 C) 98.2 F (36.8 C)  SpO2: 99% 100%     Intake/Output Summary (Last 24 hours) at 12/17/2018 5035 Last data filed at 12/16/2018 1531 Gross per 24 hour  Intake 570 ml  Output -  Net 570 ml    Physical Exam  Constitutional: He is oriented to person, place, and time.  Thin built male patient.  He is walking the hallways.  HENT:  Head: Normocephalic and atraumatic.  Mouth/Throat: Oropharynx is clear and moist. No oropharyngeal exudate.  Eyes: Pupils are equal, round, and reactive to light.  Neck: Normal range of motion. Neck supple.  Cardiovascular: Normal rate and regular rhythm.  Pulmonary/Chest: Breath sounds normal. No respiratory distress. He has no wheezes.  Abdominal: Soft. Bowel sounds are normal. He exhibits no distension and no mass. There is no abdominal tenderness. There is no rebound and no guarding.  Musculoskeletal: Normal range of motion.        General: No tenderness or edema.  Neurological: He is alert and oriented to person, place, and time.  Skin: Skin is warm.  Psychiatric: Affect normal.       Labs:  Lab Results  Component Value Date   WBC 10.7 (H) 12/17/2018   HGB 8.0 (L) 12/17/2018   HCT 25.7 (L) 12/17/2018   MCV 91.8 12/17/2018   PLT 301 12/17/2018   NEUTROABS 14.7 (H) 12/14/2018    Lab Results  Component Value Date   NA 136 12/15/2018   K 4.0 12/15/2018   CL 105 12/15/2018   CO2 25 12/15/2018    Studies:  Ct Guided Needle Placement  Result Date: 12/15/2018 CLINICAL DATA:  Colovesical fistula, sigmoid colonic mass, portal vein thrombosis and left  para-aortic retroperitoneal lymphadenopathy. The patient presents for biopsy of the left para-aortic lymph node in order to try to establish a diagnosis. EXAM: CT GUIDED CORE BIOPSY OF LEFT ABDOMINAL RETROPERITONEAL LYMPH NODE ANESTHESIA/SEDATION: 5.0 mg IV Versed; 100 mcg IV Fentanyl Total Moderate Sedation Time:  19 minutes. The patient's level of consciousness and physiologic status were continuously monitored during the procedure by Radiology nursing. PROCEDURE: The procedure risks, benefits, and alternatives were explained to the patient. Questions regarding the procedure were encouraged and answered. The patient understands and consents to the procedure. A time-out was performed prior to initiating the procedure. CT was performed in a prone position. The left trans lumbar region was prepped with chlorhexidine in a sterile fashion, and a sterile drape was applied covering the operative field. A sterile gown and sterile gloves were used for the procedure. Local anesthesia was provided with 1% Lidocaine. Under CT guidance, a 17 gauge trocar needle was advanced to the level of a left para-aortic retroperitoneal lymph node. Three separate coaxial 18 gauge core biopsy samples were obtained. Core samples were submitted in formalin. Additional CT was performed after outer needle removal. COMPLICATIONS: None FINDINGS: Left para-aortic lymph node medial to the mid left kidney was localized. This measures approximately 1.8 x 2.4 cm. Solid tissue was obtained with biopsy. IMPRESSION: CT-guided core biopsy performed of a left para-aortic abdominal retroperitoneal lymph node. Electronically Signed  By: Aletta Edouard M.D.   On: 12/15/2018 16:17    Colo-vesical fistula #48 year old male patient currently admitted to hospital for passing feculent matter in urine/CT scan abdomen pelvis concerning for colovesical fistula/also portal vein thrombosis  #Colovesical fistula/with retroperitoneal adenopathy-highly concerning  for malignancy.  Patient status post CT-guided biopsy of the retroperitoneal lymph node-preliminary pathology-negative for obvious malignancy.  Await cystoscopy with possible biopsy today with urology.  #Severe anemia hemoglobin-6.1 at presentation-anemia of chronic disease/blood loss status post 2 units of PRBC transfusion hemoglobin up to 8.  Monitor for now.  #Portal vein thrombosis-asymptomatic at this time.sec possible underlying malignancy; hold off anticoagulation at this time.  #UTI-gram-negative secondary to colovesical fistula on Zosyn.   # FHx of cancers- dad- rcc/ uncles [dad's 3 brothers "all had cancers"-1 uncle had prostate prostate; unsure about other uncles.  Denies any stomach or colon cancers of brain cancer in the family.  Patient will likely need genetic testing.  #Discussed with Dr. Posey Pronto.    Cammie Sickle, MD 12/17/2018  8:22 AM

## 2018-12-18 ENCOUNTER — Encounter: Payer: Self-pay | Admitting: Urology

## 2018-12-18 ENCOUNTER — Inpatient Hospital Stay: Payer: MEDICAID | Attending: Internal Medicine

## 2018-12-18 LAB — CBC WITH DIFFERENTIAL/PLATELET
Abs Immature Granulocytes: 0.1 10*3/uL — ABNORMAL HIGH (ref 0.00–0.07)
Basophils Absolute: 0 10*3/uL (ref 0.0–0.1)
Basophils Relative: 1 %
Eosinophils Absolute: 0.1 10*3/uL (ref 0.0–0.5)
Eosinophils Relative: 1 %
HCT: 23 % — ABNORMAL LOW (ref 39.0–52.0)
Hemoglobin: 7.2 g/dL — ABNORMAL LOW (ref 13.0–17.0)
Immature Granulocytes: 1 %
Lymphocytes Relative: 12 %
Lymphs Abs: 0.9 10*3/uL (ref 0.7–4.0)
MCH: 29 pg (ref 26.0–34.0)
MCHC: 31.3 g/dL (ref 30.0–36.0)
MCV: 92.7 fL (ref 80.0–100.0)
Monocytes Absolute: 0.5 10*3/uL (ref 0.1–1.0)
Monocytes Relative: 6 %
NEUTROS PCT: 79 %
Neutro Abs: 6.2 10*3/uL (ref 1.7–7.7)
Platelets: 287 10*3/uL (ref 150–400)
RBC: 2.48 MIL/uL — AB (ref 4.22–5.81)
RDW: 16.5 % — AB (ref 11.5–15.5)
WBC: 7.8 10*3/uL (ref 4.0–10.5)
nRBC: 0 % (ref 0.0–0.2)

## 2018-12-18 MED ORDER — MORPHINE SULFATE (PF) 2 MG/ML IV SOLN
2.0000 mg | INTRAVENOUS | Status: DC | PRN
  Administered 2018-12-18 (×2): 3 mg via INTRAVENOUS
  Administered 2018-12-18: 22:00:00 4 mg via INTRAVENOUS
  Administered 2018-12-18: 12:00:00 3 mg via INTRAVENOUS
  Administered 2018-12-19: 4 mg via INTRAVENOUS
  Administered 2018-12-19: 2 mg via INTRAVENOUS
  Administered 2018-12-19: 3 mg via INTRAVENOUS
  Administered 2018-12-19 (×2): 4 mg via INTRAVENOUS
  Administered 2018-12-19: 02:00:00 3 mg via INTRAVENOUS
  Administered 2018-12-20: 4 mg via INTRAVENOUS
  Administered 2018-12-20: 3 mg via INTRAVENOUS
  Administered 2018-12-20 (×3): 4 mg via INTRAVENOUS
  Administered 2018-12-20: 3 mg via INTRAVENOUS
  Administered 2018-12-21: 01:00:00 4 mg via INTRAVENOUS
  Filled 2018-12-18 (×12): qty 2
  Filled 2018-12-18: qty 1
  Filled 2018-12-18 (×4): qty 2

## 2018-12-18 NOTE — Progress Notes (Signed)
Tumor Board Documentation  Tony Gillespie was presented by Dr Erlene Quan at our Tumor Board on 12/18/2018, which included representatives from medical oncology, radiation oncology, surgical, radiology, pathology, navigation, internal medicine, research, palliative care, pulmonology.  Tony Gillespie currently presents as a new patient, for discussion, for Tony Gillespie with history of the following treatments: surgical intervention(s).  Additionally, we reviewed previous medical and familial history, history of present illness, and recent lab results along with all available histopathologic and imaging studies. The tumor board considered available treatment options and made the following recommendations:   to be determined after pathology from 12/17/18 available  The following procedures/referrals were also placed: No orders of the defined types were placed in this encounter.   Clinical Trial Status: not discussed   Staging used: AJCC Stage Group  National site-specific guidelines NCCN were discussed with respect to the case.  Tumor board is a meeting of clinicians from various specialty areas who evaluate and discuss patients for whom a multidisciplinary approach is being considered. Final determinations in the plan of care are those of the provider(s). The responsibility for follow up of recommendations given during tumor board is that of the provider.   Today's extended care, comprehensive team conference, Jobe was not present for the discussion and was not examined.   Multidisciplinary Tumor Board is a multidisciplinary case peer review process.  Decisions discussed in the Multidisciplinary Tumor Board reflect the opinions of the specialists present at the conference without having examined the patient.  Ultimately, treatment and diagnostic decisions rest with the primary provider(s) and the patient.

## 2018-12-18 NOTE — Progress Notes (Signed)
[facility] [place] NAME: Tony Gillespie    MR#:  [id number]  DATE OF BIRTH:  Aug 06, 1971  SUBJECTIVE:  postop day one cystoscopy with biopsy . complaining of pain. Ambulating in the hallways. No fever.  REVIEW OF SYSTEMS:   Review of Systems  Constitutional: Negative for chills, fever and weight loss.  HENT: Negative for ear discharge, ear pain and nosebleeds.   Eyes: Negative for blurred vision, pain and discharge.  Respiratory: Negative for sputum production, shortness of breath, wheezing and stridor.   Cardiovascular: Negative for chest pain, palpitations, orthopnea and PND.  Gastrointestinal: Negative for abdominal pain, diarrhea, nausea and vomiting.  Genitourinary: Positive for dysuria and hematuria. Negative for frequency and urgency.  Musculoskeletal: Negative for back pain and joint pain.  Neurological: Positive for weakness. Negative for sensory change, speech change and focal weakness.  Psychiatric/Behavioral: Negative for depression and hallucinations. The patient is not nervous/anxious.    Tolerating Diet: yes Tolerating PT: ambulating in the hallway  DRUG ALLERGIES:   Allergies  Allergen Reactions  . Erythromycin Nausea And Vomiting    VITALS:  Blood pressure 104/68, pulse 85, temperature 99 F (37.2 C), temperature source Oral, resp. rate 16, height 5\' 11"  (1.803 m), weight 70.7 kg, SpO2 98 %.  PHYSICAL EXAMINATION:   Physical Exam  GENERAL:  48 y.o.-year-old patient lying in the bed with no acute distress. Pallor + weak, thin cachectic EYES: Pupils equal, round, reactive to light and accommodation. No scleral icterus. Extraocular muscles intact.  HEENT: Head atraumatic, normocephalic. Oropharynx and nasopharynx clear.  NECK:  Supple, no jugular venous distention. No thyroid enlargement, no tenderness.  LUNGS: Normal breath sounds bilaterally, no wheezing, rales, rhonchi. No use of accessory muscles of  respiration.  CARDIOVASCULAR: S1, S2 normal. No murmurs, rubs, or gallops.  ABDOMEN: Soft, nontender, nondistended. Bowel sounds present. No organomegaly or mass.  EXTREMITIES: No cyanosis, clubbing or edema b/l.    NEUROLOGIC: Cranial nerves II through XII are intact. No focal Motor or sensory deficits b/l.   PSYCHIATRIC:  patient is alert and oriented x 3.  SKIN: No obvious rash, lesion, or ulcer.   LABORATORY PANEL:  CBC Recent Labs  Lab 12/18/18 0441  WBC 7.8  HGB 7.2*  HCT 23.0*  PLT 287    Chemistries  Recent Labs  Lab 12/14/18 0953 12/15/18 0511  NA 132* 136  K 4.3 4.0  CL 102 105  CO2 24 25  GLUCOSE 110* 122*  BUN 10 12  CREATININE 0.97 0.70  CALCIUM 8.2* 7.9*  AST 52*  --   ALT 49*  --   ALKPHOS 242*  --   BILITOT 1.7*  --    Cardiac Enzymes No results for input(s): TROPONINI in the last 168 hours. RADIOLOGY:  No results found. ASSESSMENT AND PLAN:  Tony Gillespie  is a 48 y.o. male with a known history of no medical issues, did not go to a physician for last 2 years and does not take any prescription medicines at home. For last 1 month he also started noticing dirty looking urine which lately converted having stool and gas coming through the urine.  He also had few episodes where he has blood coming through the urine.  1. Symptomatic anemia with hematuria and foul-smelling urine with abnormal CT scan showing suspected bladder mass vs colon mass and Colovesicle fistula -patient is status post core biopsy of  2.4 cm paraaortic retroperitoneal lymph node CT guided-- by Dr. Kathlene Cote--  biopsy results inconclusive -oncology consultation with Dr. Rogue Bussing -hemoglobin 6.7--- 2 unit blood transfusion-- 7.1--8.0 -appreciate urology and surgical plan.  -s/p cystoscopy and  Biopsy by Dr Erlene Quan on 12/17/2018  2. UTI -IV Zosyn--change to po keflex -UC  e coli pansensitive  3. 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 -This was noted on CT scan of the  abdomen  4. DVT SCD  Management plans discussed with the patient and they are in agreement.  CODE STATUS: full code  DVT Prophylaxis: SCD  TOTAL TIME TAKING CARE OF THIS PATIENT: *30* minutes.  >50% time spent on counselling and coordination of care  POSSIBLE D/C IN *2-3* DAYS, DEPENDING ON CLINICAL CONDITION.  Note: This dictation was prepared with Dragon dictation along with smaller phrase technology. Any transcriptional errors that result from this process are unintentional.  Tony Gillespie M.D on 12/18/2018 at 2:35 PM  Between 7am to 6pm - Pager - [phone]  After 6pm go to [email or web address] - password [email or web address] [facility]  Office  [phone]  CC: Primary care physician; Patient, No Pcp PerPatient ID: Tony Gillespie, male   DOB: 12-Jun-1971, 48 y.o.   MRN: [id number]

## 2018-12-18 NOTE — Progress Notes (Signed)
Urology consult follow-up  Some mild hematuria overnight which is since cleared.  Continues to have pneumaturia and focal area.  Mild lower abdominal pain which is stable.  BM yesterday.  No nausea or vomiting.  Tolerating diet.  Blood pressure 104/67, pulse 76, temperature 98.4 F (36.9 C), temperature source Oral, resp. rate 16, height 5\' 11"  (1.803 m), weight 70.7 kg, SpO2 97 %  Physical Exam Vitals signs reviewed.  Constitutional:      Comments: Improved color, slightly cachectic appearing  HENT:     Head: Normocephalic and atraumatic.     Mouth/Throat:     Mouth: Mucous membranes are moist.  Abdominal:     General: Abdomen is flat.     Palpations: Abdomen is soft.     Comments: Minimal suprapubic tenderness  Skin:    General: Skin is warm and dry.  Neurological:     General: No focal deficit present.     Mental Status: He is alert and oriented to person, place, and time.    CBC    Component Value Date/Time   WBC 7.8 12/18/2018 0441   RBC 2.48 (L) 12/18/2018 0441   HGB 7.2 (L) 12/18/2018 0441   HCT 23.0 (L) 12/18/2018 0441   PLT 287 12/18/2018 0441   MCV 92.7 12/18/2018 0441   MCH 29.0 12/18/2018 0441   MCHC 31.3 12/18/2018 0441   RDW 16.5 (H) 12/18/2018 0441   LYMPHSABS 0.9 12/18/2018 0441   MONOABS 0.5 12/18/2018 0441   EOSABS 0.1 12/18/2018 0441   BASOSABS 0.0 12/18/2018 0441   CEA pending  Assessment and plan:  48 year old male with colovesical fistula,?  Malignant status post bladder biopsy yesterday.  Awaiting pathology.  Retroperitoneal biopsy consistent with reactive tissue. -Continue supportive care -Agree with continued antibiotics -Regular diet -Follow-up with pathology, plan to be determined based on whether this is benign or malignant process  Hollice Espy, MD

## 2018-12-19 ENCOUNTER — Encounter: Payer: Self-pay | Admitting: *Deleted

## 2018-12-19 LAB — PREPARE RBC (CROSSMATCH)

## 2018-12-19 LAB — CULTURE, BLOOD (ROUTINE X 2)
Culture: NO GROWTH
Culture: NO GROWTH
Special Requests: ADEQUATE

## 2018-12-19 LAB — SURGICAL PATHOLOGY

## 2018-12-19 LAB — CEA: CEA1: 2.6 ng/mL (ref 0.0–4.7)

## 2018-12-19 MED ORDER — RAMELTEON 8 MG PO TABS
8.0000 mg | ORAL_TABLET | Freq: Every day | ORAL | Status: DC
  Administered 2018-12-19 (×2): 8 mg via ORAL
  Filled 2018-12-19 (×3): qty 1

## 2018-12-19 MED ORDER — RAMELTEON 8 MG PO TABS: 8.0000 mg | ORAL_TABLET | Freq: Every day | ORAL | Status: DC

## 2018-12-19 MED ORDER — POLYETHYLENE GLYCOL 3350 17 GM/SCOOP PO POWD: 1.0000 | Freq: Once | ORAL | Status: DC

## 2018-12-19 MED ORDER — LACTATED RINGERS IV SOLN
INTRAVENOUS | Status: DC
  Administered 2018-12-22 (×2): via INTRAVENOUS

## 2018-12-19 MED ORDER — SODIUM CHLORIDE 0.9% IV SOLUTION
Freq: Once | INTRAVENOUS | Status: AC
  Administered 2018-12-19: 13:00:00 via INTRAVENOUS

## 2018-12-19 MED ORDER — BISACODYL 5 MG PO TBEC: 20.0000 mg | DELAYED_RELEASE_TABLET | Freq: Once | ORAL | Status: DC

## 2018-12-19 MED ORDER — POLYETHYLENE GLYCOL 3350 17 GM/SCOOP PO POWD
1.0000 | Freq: Once | ORAL | Status: AC
  Administered 2018-12-21: 255 g via ORAL
  Filled 2018-12-19: qty 255

## 2018-12-19 NOTE — Progress Notes (Signed)
[place] [facility] [place] NAME: Tony Gillespie    MR#:  [id number]  DATE OF BIRTH:  03-Dec-1970  SUBJECTIVE:   complaining of pain. Ambulating in the hallways. No fever.  REVIEW OF SYSTEMS:   Review of Systems  Constitutional: Negative for chills, fever and weight loss.  HENT: Negative for ear discharge, ear pain and nosebleeds.   Eyes: Negative for blurred vision, pain and discharge.  Respiratory: Negative for sputum production, shortness of breath, wheezing and stridor.   Cardiovascular: Negative for chest pain, palpitations, orthopnea and PND.  Gastrointestinal: Negative for abdominal pain, diarrhea, nausea and vomiting.  Genitourinary: Positive for dysuria and hematuria. Negative for frequency and urgency.  Musculoskeletal: Negative for back pain and joint pain.  Neurological: Positive for weakness. Negative for sensory change, speech change and focal weakness.  Psychiatric/Behavioral: Negative for depression and hallucinations. The patient is not nervous/anxious.    Tolerating Diet: yes Tolerating PT: ambulating in the hallway  DRUG ALLERGIES:   Allergies  Allergen Reactions  . Erythromycin Nausea And Vomiting    VITALS:  Blood pressure 108/63, pulse 80, temperature 98 F (36.7 C), temperature source Oral, resp. rate 18, height 5\' 11"  (1.803 m), weight 70.7 kg, SpO2 100 %.  PHYSICAL EXAMINATION:   Physical Exam  GENERAL:  48 y.o.-year-old patient lying in the bed with no acute distress. Pallor + weak, thin cachectic EYES: Pupils equal, round, reactive to light and accommodation. No scleral icterus. Extraocular muscles intact.  HEENT: Head atraumatic, normocephalic. Oropharynx and nasopharynx clear.  NECK:  Supple, no jugular venous distention. No thyroid enlargement, no tenderness.  LUNGS: Normal breath sounds bilaterally, no wheezing, rales, rhonchi. No use of accessory muscles of respiration.  CARDIOVASCULAR: S1, S2  normal. No murmurs, rubs, or gallops.  ABDOMEN: Soft, nontender, nondistended. Bowel sounds present. No organomegaly or mass.  EXTREMITIES: No cyanosis, clubbing or edema b/l.    NEUROLOGIC: Cranial nerves II through XII are intact. No focal Motor or sensory deficits b/l.   PSYCHIATRIC:  patient is alert and oriented x 3.  SKIN: No obvious rash, lesion, or ulcer.   LABORATORY PANEL:  CBC Recent Labs  Lab 12/18/18 0441  WBC 7.8  HGB 7.2*  HCT 23.0*  PLT 287    Chemistries  Recent Labs  Lab 12/14/18 0953 12/15/18 0511  NA 132* 136  K 4.3 4.0  CL 102 105  CO2 24 25  GLUCOSE 110* 122*  BUN 10 12  CREATININE 0.97 0.70  CALCIUM 8.2* 7.9*  AST 52*  --   ALT 49*  --   ALKPHOS 242*  --   BILITOT 1.7*  --    Cardiac Enzymes No results for input(s): TROPONINI in the last 168 hours. RADIOLOGY:  No results found. ASSESSMENT AND PLAN:  Tony Gillespie  is a 48 y.o. male with a known history of no medical issues, did not go to a physician for last 2 years and does not take any prescription medicines at home. For last 1 month he also started noticing dirty looking urine which lately converted having stool and gas coming through the urine.  He also had few episodes where he has blood coming through the urine.  1. Symptomatic anemia with hematuria and foul-smelling urine with abnormal CT scan showing suspected bladder mass vs colon mass and Colovesicle fistula -patient is status post core biopsy of  2.4 cm paraaortic retroperitoneal lymph node CT guided-- by Dr. Kathlene Cote-- biopsy results inconclusive -oncology consultation with  Dr. Rogue Bussing -hemoglobin 6.7--- 2 unit blood transfusion-- 7.1--8.0 -appreciate urology and surgical plan.  -s/p cystoscopy and  Biopsy by Dr Erlene Quan on 12/17/2018 -URINARY BLADDER, ANTERIOR WALL; BIOPSY:  -CYSTITIS CYSTICA AND CYSTITIS GLANDULARIS, WITH INTESTINAL METAPLASIA.  - NEGATIVE FOR DYSPLASIA AND MALIGNANCY IN THIS SPECIMEN.   -Dr Lysle Pearl to perform  flex sig/colonoscopy on monday  2. UTI -Ion po keflex -UC  e coli pansensitive  3. 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 -This was noted on CT scan of the abdomen  4. DVT SCD  Management plans discussed with the patient and they are in agreement.  CODE STATUS: full code  DVT Prophylaxis: SCD  TOTAL TIME TAKING CARE OF THIS PATIENT: *25* minutes.  >50% time spent on counselling and coordination of care  POSSIBLE D/C IN *?? DAYS, DEPENDING ON CLINICAL CONDITION.  Note: This dictation was prepared with Dragon dictation along with smaller phrase technology. Any transcriptional errors that result from this process are unintentional.  Tony Gillespie M.D on 12/19/2018 at 2:45 PM  Between 7am to 6pm - Pager - [phone]  After 6pm go to [email or web address] - password [email or web address] [facility]  Office  [phone]  CC: Primary care physician; Patient, No Pcp PerPatient ID: Tony Gillespie, male   DOB: 01-28-1971, 48 y.o.   MRN: [id number]

## 2018-12-19 NOTE — Progress Notes (Signed)
.   Tony Gillespie   DOB:09/09/1971   ER#:[id number]    Subjective: Patient denies having fevers/severe chills.  However had episodes of sweating yesterday.  Noted to have slightly worsened abdominal discomfort suprapubic area.  Patient had PRBC transfusion yesterday uneventful.  Objective:  Vitals:   12/18/18 2018 12/19/18 0417  BP: 118/80 106/61  Pulse: 93 86  Resp: 20 (!) 21  Temp: 99.1 F (37.3 C) 99.2 F (37.3 C)  SpO2: 100% 97%     Intake/Output Summary (Last 24 hours) at 12/19/2018 1043 Last data filed at 12/19/2018 0951 Gross per 24 hour  Intake 600 ml  Output -  Net 600 ml    Physical Exam  Constitutional: He is oriented to person, place, and time.  Thin built male patient.  He is walking the hallways.  HENT:  Head: Normocephalic and atraumatic.  Mouth/Throat: Oropharynx is clear and moist. No oropharyngeal exudate.  Eyes: Pupils are equal, round, and reactive to light.  Neck: Normal range of motion. Neck supple.  Cardiovascular: Normal rate and regular rhythm.  Pulmonary/Chest: Breath sounds normal. No respiratory distress. He has no wheezes.  Abdominal: Soft. Bowel sounds are normal. He exhibits no distension and no mass. There is no abdominal tenderness. There is no rebound and no guarding.  Musculoskeletal: Normal range of motion.        General: No tenderness or edema.  Neurological: He is alert and oriented to person, place, and time.  Skin: Skin is warm.  Psychiatric: Affect normal.       Labs:  Lab Results  Component Value Date   WBC 7.8 12/18/2018   HGB 7.2 (L) 12/18/2018   HCT 23.0 (L) 12/18/2018   MCV 92.7 12/18/2018   PLT 287 12/18/2018   NEUTROABS 6.2 12/18/2018    Lab Results  Component Value Date   NA 136 12/15/2018   K 4.0 12/15/2018   CL 105 12/15/2018   CO2 25 12/15/2018    Studies:  No results found.  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   CT-guided biopsy of the retroperitoneal lymph node-NEG for malignancy. Await bladder Bx results.   #Severe anemia hemoglobin-7.2; sec to ACD/IDA. Transfuse as needed.    #Portal vein thrombosis-asymptomatic at this time.sec possible underlying malignancy; hold off anticoagulation at this time.  #UTI-gram-negative secondary to colovesical fistula on Zosyn.   # FHx of cancers- dad- rcc/ uncles [dad's 3 brothers "all had cancers"-1 uncle had prostate prostate; unsure about other uncles.  Denies any stomach or colon cancers of brain cancer in the family.  If positive for malignancy- then refer to genetics out pt.   # Discussed with Dr.Patel; and Drs. Sakai/ and Leith. Also reviewed at the Tumor conference.    Cammie Sickle, MD

## 2018-12-19 NOTE — Progress Notes (Signed)
Subjective:  CC:  Tony Gillespie is a 48 y.o. male  Hospital stay day 5, 2 Days Post-Op colovesicular fistula, unknown etiology  HPI: No acute issues overnight.   ROS:  General: Denies weight loss, weight gain, fatigue, fevers, chills, and night sweats. Heart: Denies chest pain, palpitations, racing heart, irregular heartbeat, leg pain or swelling, and decreased activity tolerance. Respiratory: Denies breathing difficulty, shortness of breath, wheezing, cough, and sputum. GI: Denies change in appetite, heartburn, nausea, vomiting, constipation, diarrhea, and blood in stool.   Objective:      Temp:  [99 F (37.2 C)-99.2 F (37.3 C)] 99.2 F (37.3 C) (02/21 0417) Pulse Rate:  [85-93] 86 (02/21 0417) Resp:  [16-21] 21 (02/21 0417) BP: (104-118)/(61-80) 106/61 (02/21 0417) SpO2:  [97 %-100 %] 97 % (02/21 0417)     Height: 5\' 11"  (180.3 cm) Weight: 70.7 kg BMI (Calculated): 21.75   Intake/Output this shift:   Intake/Output Summary (Last 24 hours) at 12/19/2018 1042 Last data filed at 12/19/2018 0951 Gross per 24 hour  Intake 600 ml  Output -  Net 600 ml     Constitutional :  alert, cooperative, appears stated age and no distress  Respiratory:  clear to auscultation bilaterally  Cardiovascular:  regular rate and rhythm  Gastrointestinal: soft, non-tender; bowel sounds normal; no masses,  no organomegaly.   Skin: Cool and moist.   Psychiatric: Normal affect, non-agitated, not confused       LABS:  CMP Latest Ref Rng & Units 12/15/2018 12/14/2018  Glucose 70 - 99 mg/dL 122(H) 110(H)  BUN 6 - 20 mg/dL 12 10  Creatinine 0.61 - 1.24 mg/dL 0.70 0.97  Sodium 135 - 145 mmol/L 136 132(L)  Potassium 3.5 - 5.1 mmol/L 4.0 4.3  Chloride 98 - 111 mmol/L 105 102  CO2 22 - 32 mmol/L 25 24  Calcium 8.9 - 10.3 mg/dL 7.9(L) 8.2(L)  Total Protein 6.5 - 8.1 g/dL - 6.8  Total Bilirubin 0.3 - 1.2 mg/dL - 1.7(H)  Alkaline Phos 38 - 126 U/L - 242(H)  AST 15 - 41 U/L - 52(H)  ALT 0 - 44 U/L  - 49(H)   CBC Latest Ref Rng & Units 12/18/2018 12/17/2018 12/16/2018  WBC 4.0 - 10.5 K/uL 7.8 10.7(H) 11.2(H)  Hemoglobin 13.0 - 17.0 g/dL 7.2(L) 8.0(L) 7.1(L)  Hematocrit 39.0 - 52.0 % 23.0(L) 25.7(L) 22.4(L)  Platelets 150 - 400 K/uL 287 301 291    RADS: IR biopsy of retroperitoneal LN, negative for malignancy per verbal report. Assessment:   colovesicular fistula, unknown etiology.  LN negative for malignancy.  S/p cysto and biopsy, likely benign.  Will likely need to proceed with flex sig for biopsy from colon side early next week.  Noted low hgb again.  Some reports of hematuria.  Recommend transfusion

## 2018-12-20 LAB — TYPE AND SCREEN
ABO/RH(D): O POS
Antibody Screen: NEGATIVE
Unit division: 0

## 2018-12-20 LAB — BPAM RBC
Blood Product Expiration Date: 202003082359
ISSUE DATE / TIME: 202002211353
Unit Type and Rh: 5100

## 2018-12-20 MED ORDER — HYDROMORPHONE HCL 1 MG/ML IJ SOLN
0.5000 mg | Freq: Once | INTRAMUSCULAR | Status: AC
  Administered 2018-12-20: 0.5 mg via INTRAVENOUS
  Filled 2018-12-20: qty 1

## 2018-12-20 MED ORDER — LORAZEPAM 0.5 MG PO TABS: 0.5000 mg | ORAL_TABLET | Freq: Four times a day (QID) | ORAL | Status: DC | PRN

## 2018-12-20 MED ORDER — ALPRAZOLAM 0.25 MG PO TABS: 0.2500 mg | ORAL_TABLET | Freq: Four times a day (QID) | ORAL | Status: DC | PRN

## 2018-12-20 MED ORDER — TRAZODONE HCL 50 MG PO TABS
50.0000 mg | ORAL_TABLET | Freq: Every evening | ORAL | Status: DC | PRN
  Administered 2018-12-20: 22:00:00 50 mg via ORAL
  Filled 2018-12-20: qty 1

## 2018-12-20 MED ORDER — OXYCODONE HCL 5 MG PO TABS
10.0000 mg | ORAL_TABLET | ORAL | Status: DC | PRN
  Administered 2018-12-20 – 2018-12-21 (×3): 10 mg via ORAL
  Filled 2018-12-20 (×3): qty 2

## 2018-12-20 MED ORDER — BISACODYL 5 MG PO TBEC
20.0000 mg | DELAYED_RELEASE_TABLET | Freq: Once | ORAL | Status: AC
  Administered 2018-12-20: 20 mg via ORAL
  Filled 2018-12-20: qty 4

## 2018-12-20 NOTE — Progress Notes (Signed)
[place] at [place] NAME: Tony Gillespie    MR#:  [id number]  DATE OF BIRTH:  1971-10-02  SUBJECTIVE:  CHIEF COMPLAINT:   Chief Complaint  Patient presents with  . Urinary Tract Infection   - still complains of lower abdominal pain - no nausea/vomiting  REVIEW OF SYSTEMS:  Review of Systems  Constitutional: Negative for chills, fever and malaise/fatigue.  HENT: Negative for congestion, ear discharge, hearing loss and nosebleeds.   Eyes: Negative for blurred vision and double vision.  Respiratory: Negative for cough, shortness of breath and wheezing.   Cardiovascular: Negative for chest pain, palpitations and leg swelling.  Gastrointestinal: Positive for abdominal pain. Negative for constipation, diarrhea, nausea and vomiting.  Genitourinary: Negative for dysuria.  Musculoskeletal: Positive for myalgias.  Neurological: Negative for dizziness, focal weakness, seizures, weakness and headaches.  Psychiatric/Behavioral: Negative for depression.    DRUG ALLERGIES:   Allergies  Allergen Reactions  . Erythromycin Nausea And Vomiting    VITALS:  Blood pressure 111/79, pulse 69, temperature (!) 97.5 F (36.4 C), temperature source Oral, resp. rate 17, height 5\' 11"  (1.803 m), weight 97.3 kg, SpO2 98 %.  PHYSICAL EXAMINATION:  Physical Exam  GENERAL:  48 y.o.-year-old patient lying in the bed with no acute distress.  EYES: Pupils equal, round, reactive to light and accommodation. No scleral icterus. Extraocular muscles intact.  HEENT: Head atraumatic, normocephalic. Oropharynx and nasopharynx clear.  NECK:  Supple, no jugular venous distention. No thyroid enlargement, no tenderness.  LUNGS: Normal breath sounds bilaterally, no wheezing, rales,rhonchi or crepitation. No use of accessory muscles of respiration.  CARDIOVASCULAR: S1, S2 normal. No murmurs, rubs, or gallops.  ABDOMEN: Soft, tender in the lower abdomen,  distended. Bowel  sounds present. No organomegaly or mass.  EXTREMITIES: No pedal edema, cyanosis, or clubbing.  NEUROLOGIC: Cranial nerves II through XII are intact. Muscle strength 5/5 in all extremities. Sensation intact. Gait not checked.  PSYCHIATRIC: The patient is alert and oriented x 3.  SKIN: No obvious rash, lesion, or ulcer.    LABORATORY PANEL:   CBC Recent Labs  Lab 12/18/18 0441  WBC 7.8  HGB 7.2*  HCT 23.0*  PLT 287   ------------------------------------------------------------------------------------------------------------------  Chemistries  Recent Labs  Lab 12/14/18 0953 12/15/18 0511  NA 132* 136  K 4.3 4.0  CL 102 105  CO2 24 25  GLUCOSE 110* 122*  BUN 10 12  CREATININE 0.97 0.70  CALCIUM 8.2* 7.9*  AST 52*  --   ALT 49*  --   ALKPHOS 242*  --   BILITOT 1.7*  --    ------------------------------------------------------------------------------------------------------------------  Cardiac Enzymes No results for input(s): TROPONINI in the last 168 hours. ------------------------------------------------------------------------------------------------------------------  RADIOLOGY:  No results found.  EKG:  No orders found for this or any previous visit.  ASSESSMENT AND PLAN:   PatrickMorrowis a48 y.o.malewith a known history of no medical issues, did not go to a physician for last 2 years and does not take any prescription medicines at home. For last 1 month he also started noticingdirty looking urine which lately converted having stool and gas coming through the urine. He also had few episodes where he has blood coming through the urine.  1. Symptomatic anemia with hematuria and foul-smelling urine with abnormal CT scan showing suspected bladder mass vs colon mass and Colovesicle fistula -patient is status post core biopsy of  2.4 cm paraaortic retroperitoneal lymph node CT guided-- by Dr. Kathlene Gillespie-- biopsy results inconclusive -oncology  consultation  with Dr. Rogue Gillespie -hemoglobin 6.7--- 2 unit blood transfusion-- 7.2 -appreciate urology and surgical plan.  -s/p cystoscopy and  Biopsy by Dr Tony Gillespie on 12/17/2018 -URINARY BLADDER, ANTERIOR WALL; BIOPSY:  -CYSTITIS CYSTICA AND CYSTITIS GLANDULARIS, WITH INTESTINAL METAPLASIA.  - NEGATIVE FOR DYSPLASIA AND MALIGNANCY IN THIS SPECIMEN.  -Surgery to perform flex sig/colonoscopy on Monday - pain meds being adjusted  2. UTI -  po keflex- stop after 5 days -UC  e coli pansensitive  3. 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 -This was noted on CT scan of the abdomen  4. DVT SCDs only No heparin products for now due to anemia     All the records are reviewed and case discussed with Care Management/Social Workerr. Management plans discussed with the patient, family and they are in agreement.  CODE STATUS: Full Code  TOTAL TIME TAKING CARE OF THIS PATIENT: 38 minutes.   POSSIBLE D/C IN 3-4 DAYS, DEPENDING ON CLINICAL CONDITION.   Tony Gillespie M.D on 12/20/2018 at 3:08 PM  Between 7am to 6pm - Pager - [phone]  After 6pm go to [email or web address] - password [email or web address] [facility]  Office  [phone]  CC: Primary care physician; Patient, No Pcp Per

## 2018-12-20 NOTE — Progress Notes (Signed)
[place] Hospital Day(s): 6.   Post op day(s): 3 Days Post-Op.   Interval History: Patient seen and examined, no acute events or new complaints overnight. Patient reports having hematuria.  He denies rectal bleeding.  He refers that since the cystoscopy he has had persistent soreness on the supraumbilical area. denies fever.  Vital signs in last 24 hours: [min-max] current  Temp:  [97.5 F (36.4 C)-98.5 F (36.9 C)] 97.5 F (36.4 C) (02/22 0441) Pulse Rate:  [69-87] 69 (02/22 0441) Resp:  [17-22] 17 (02/22 0441) BP: (102-114)/(63-79) 111/79 (02/22 0441) SpO2:  [96 %-100 %] 98 % (02/22 0441)     Height: 5\' 11"  (180.3 cm) Weight: 70.7 kg BMI (Calculated): 21.75    Physical Exam:  Constitutional: alert, cooperative and no distress  Respiratory: breathing non-labored at rest  Cardiovascular: regular rate and sinus rhythm  Gastrointestinal: soft, mild-tender suprapubic area, and non-distended  Labs:  CBC Latest Ref Rng & Units 12/18/2018 12/17/2018 12/16/2018  WBC 4.0 - 10.5 K/uL 7.8 10.7(H) 11.2(H)  Hemoglobin 13.0 - 17.0 g/dL 7.2(L) 8.0(L) 7.1(L)  Hematocrit 39.0 - 52.0 % 23.0(L) 25.7(L) 22.4(L)  Platelets 150 - 400 K/uL 287 301 291   CMP Latest Ref Rng & Units 12/15/2018 12/14/2018  Glucose 70 - 99 mg/dL 122(H) 110(H)  BUN 6 - 20 mg/dL 12 10  Creatinine 0.61 - 1.24 mg/dL 0.70 0.97  Sodium 135 - 145 mmol/L 136 132(L)  Potassium 3.5 - 5.1 mmol/L 4.0 4.3  Chloride 98 - 111 mmol/L 105 102  CO2 22 - 32 mmol/L 25 24  Calcium 8.9 - 10.3 mg/dL 7.9(L) 8.2(L)  Total Protein 6.5 - 8.1 g/dL - 6.8  Total Bilirubin 0.3 - 1.2 mg/dL - 1.7(H)  Alkaline Phos 38 - 126 U/L - 242(H)  AST 15 - 41 U/L - 52(H)  ALT 0 - 44 U/L - 49(H)    Imaging studies: No new pertinent imaging studies   Assessment/Plan:  48 y.o. male with a colovesical fistula of unknown etiology.  Patient with no clinical deterioration.  Currently GI symptoms are controlled with current antibiotic  therapy.  Patient was oriented again about the plan of having at short sigmoidoscopy for evaluation of the colonic side of the fistula.  Will start clear liquid diet tomorrow and bowel prep.  Will hold heparin on Monday.  Patient can continue his diet today.  Will follow closely  Arnold Long, MD  \

## 2018-12-21 LAB — BASIC METABOLIC PANEL
Anion gap: 8 (ref 5–15)
BUN: 12 mg/dL (ref 6–20)
CO2: 24 mmol/L (ref 22–32)
Calcium: 8.6 mg/dL — ABNORMAL LOW (ref 8.9–10.3)
Chloride: 103 mmol/L (ref 98–111)
Creatinine, Ser: 0.61 mg/dL (ref 0.61–1.24)
GFR calc Af Amer: >60 mL/min (ref >60)
GFR calc non Af Amer: >60 mL/min (ref >60)
Glucose, Bld: 108 mg/dL — ABNORMAL HIGH (ref 70–99)
Potassium: 3.9 mmol/L (ref 3.5–5.1)
Sodium: 135 mmol/L (ref 135–145)

## 2018-12-21 LAB — CBC
HCT: 27.7 % — ABNORMAL LOW (ref 39.0–52.0)
Hemoglobin: 8.8 g/dL — ABNORMAL LOW (ref 13.0–17.0)
MCH: 29.2 pg (ref 26.0–34.0)
MCHC: 31.8 g/dL (ref 30.0–36.0)
MCV: 92 fL (ref 80.0–100.0)
Platelets: 329 10*3/uL (ref 150–400)
RBC: 3.01 MIL/uL — ABNORMAL LOW (ref 4.22–5.81)
RDW: 15.9 % — ABNORMAL HIGH (ref 11.5–15.5)
WBC: 10.4 10*3/uL (ref 4.0–10.5)
nRBC: 0 % (ref 0.0–0.2)

## 2018-12-21 MED ORDER — PHENYLEPHRINE IN HARD FAT 0.25 % RE SUPP
1.0000 | Freq: Two times a day (BID) | RECTAL | Status: DC | PRN
  Filled 2018-12-21 (×2): qty 1

## 2018-12-21 MED ORDER — PHENYLEPH-SHARK LIV OIL-MO-PET 0.25-3-14-71.9 % RE OINT
TOPICAL_OINTMENT | Freq: Two times a day (BID) | RECTAL | Status: DC | PRN
  Administered 2018-12-21: 06:00:00 via RECTAL
  Filled 2018-12-21: qty 57

## 2018-12-21 MED ORDER — MORPHINE SULFATE (PF) 2 MG/ML IV SOLN
2.0000 mg | INTRAVENOUS | Status: DC | PRN
  Administered 2018-12-21 – 2018-12-22 (×5): 4 mg via INTRAVENOUS
  Administered 2018-12-22 (×2): 3 mg via INTRAVENOUS
  Administered 2018-12-23 (×3): 4 mg via INTRAVENOUS
  Filled 2018-12-21 (×11): qty 2

## 2018-12-21 MED ORDER — BISACODYL 10 MG RE SUPP
10.0000 mg | Freq: Once | RECTAL | Status: AC
  Administered 2018-12-22: 10 mg via RECTAL
  Filled 2018-12-21: qty 1

## 2018-12-21 MED ORDER — HYDROMORPHONE HCL 1 MG/ML IJ SOLN
1.0000 mg | Freq: Four times a day (QID) | INTRAMUSCULAR | Status: DC | PRN
  Administered 2018-12-21 – 2018-12-26 (×18): 1 mg via INTRAVENOUS
  Filled 2018-12-21 (×18): qty 1

## 2018-12-21 MED ORDER — OXYCODONE HCL 5 MG PO TABS
10.0000 mg | ORAL_TABLET | ORAL | Status: DC | PRN
  Administered 2018-12-21 – 2018-12-23 (×11): 10 mg via ORAL
  Filled 2018-12-21 (×12): qty 2

## 2018-12-21 MED ORDER — SODIUM CHLORIDE 0.9 % IV SOLN
INTRAVENOUS | Status: DC
  Administered 2018-12-21: 18:00:00 via INTRAVENOUS

## 2018-12-21 NOTE — Progress Notes (Signed)
Pnt pain increased 10/10 overnight requiring notification to MD to intervene with additional pain meds if possible. One time order of 0.5mg  Dilaudid given, per Dr. Jannifer Franklin order, with little effect however pnt reported more of an effect than the Morphine. Notified Dr Marcille Blanco later in night of increased pain 10/10 again. Dr Marcille Blanco modified frequency of pain meds, gave as ordered, which has been managing pain 7/10 since.      Also asked MD if putting back on fluids would be possible to help with flow of urine as pnt is having increasing difficulty with flow from penis. PO intake has also not been substantial.  MIVF started at 50/hr per order at approx 0300 which pnt reports to have also helped.

## 2018-12-21 NOTE — Progress Notes (Signed)
[facility] Hospital Day(s): 7.   Post op day(s): 4 Days Post-Op.   Interval History: Patient seen and examined, no acute events or new complaints overnight. Patient reports feeling better today.  Reports he understand the plan of clear liquid diet today, doing the bowel preparation today too.  Denies any fever or chills.  Denies any abdominal pain today.  Denies burning during urination.  Vital signs in last 24 hours: [min-max] current  Temp:  [98.2 F (36.8 C)-98.6 F (37 C)] 98.3 F (36.8 C) (02/23 0450) Pulse Rate:  [75-91] 91 (02/23 0450) Resp:  [18-20] 20 (02/23 0450) BP: (121-127)/(80-92) 121/80 (02/23 0450) SpO2:  [96 %-98 %] 96 % (02/23 0450) Weight:  [97.3 kg] 97.3 kg (02/22 1420)     Height: 5\' 11"  (180.3 cm) Weight: 97.3 kg BMI (Calculated): 29.93   Physical Exam:  Constitutional: alert, cooperative and no distress  Respiratory: breathing non-labored at rest  Cardiovascular: regular rate and sinus rhythm  Gastrointestinal: soft, non-tender, and non-distended  Labs:  CBC Latest Ref Rng & Units 12/21/2018 12/18/2018 12/17/2018  WBC 4.0 - 10.5 K/uL 10.4 7.8 10.7(H)  Hemoglobin 13.0 - 17.0 g/dL 8.8(L) 7.2(L) 8.0(L)  Hematocrit 39.0 - 52.0 % 27.7(L) 23.0(L) 25.7(L)  Platelets 150 - 400 K/uL 329 287 301   CMP Latest Ref Rng & Units 12/21/2018 12/15/2018 12/14/2018  Glucose 70 - 99 mg/dL 108(H) 122(H) 110(H)  BUN 6 - 20 mg/dL 12 12 10   Creatinine 0.61 - 1.24 mg/dL 0.61 0.70 0.97  Sodium 135 - 145 mmol/L 135 136 132(L)  Potassium 3.5 - 5.1 mmol/L 3.9 4.0 4.3  Chloride 98 - 111 mmol/L 103 105 102  CO2 22 - 32 mmol/L 24 25 24   Calcium 8.9 - 10.3 mg/dL 8.6(L) 7.9(L) 8.2(L)  Total Protein 6.5 - 8.1 g/dL - - 6.8  Total Bilirubin 0.3 - 1.2 mg/dL - - 1.7(H)  Alkaline Phos 38 - 126 U/L - - 242(H)  AST 15 - 41 U/L - - 52(H)  ALT 0 - 44 U/L - - 49(H)    Imaging studies: No new pertinent imaging studies   Assessment/Plan:  48 y.o. male with a colovesical  fistula of unknown etiology.  Patient without any clinical situation.  He is ready to start clear liquid diet today and a bowel prep.  Patient is not on pharmacologic DVT prophylaxis due to previous sign of bleeding.  Patient is ambulating and active.  Plan is to prepare patient for tomorrow for flexible sigmoidoscopy and possible biopsy of the area of the fistula.  Agree with current management by hospitalist.  Agree with urology recommendations.  Arnold Long, MD

## 2018-12-21 NOTE — Plan of Care (Signed)

## 2018-12-21 NOTE — Progress Notes (Signed)
[facility] [place] NAME: Tony Gillespie    MR#:  [id number]  DATE OF BIRTH:  1971-04-30  SUBJECTIVE:  CHIEF COMPLAINT:   Chief Complaint  Patient presents with  . Urinary Tract Infection   -Up and ambulating.  For sigmoidoscopy and biopsy tomorrow -Pain is still averaging at 6-8  REVIEW OF SYSTEMS:  Review of Systems  Constitutional: Negative for chills, fever and malaise/fatigue.  HENT: Negative for congestion, ear discharge, hearing loss and nosebleeds.   Eyes: Negative for blurred vision and double vision.  Respiratory: Negative for cough, shortness of breath and wheezing.   Cardiovascular: Negative for chest pain, palpitations and leg swelling.  Gastrointestinal: Positive for abdominal pain. Negative for constipation, diarrhea, nausea and vomiting.  Genitourinary: Negative for dysuria.  Musculoskeletal: Positive for myalgias.  Neurological: Negative for dizziness, focal weakness, seizures, weakness and headaches.  Psychiatric/Behavioral: Negative for depression.    DRUG ALLERGIES:   Allergies  Allergen Reactions  . Erythromycin Nausea And Vomiting    VITALS:  Blood pressure 121/80, pulse 91, temperature 98.3 F (36.8 C), temperature source Oral, resp. rate 20, height 5\' 11"  (1.803 m), weight 97.3 kg, SpO2 96 %.  PHYSICAL EXAMINATION:  Physical Exam  GENERAL:  48 y.o.-year-old patient lying in the bed with no acute distress.  EYES: Pupils equal, round, reactive to light and accommodation. No scleral icterus. Extraocular muscles intact.  HEENT: Head atraumatic, normocephalic. Oropharynx and nasopharynx clear.  NECK:  Supple, no jugular venous distention. No thyroid enlargement, no tenderness.  LUNGS: Normal breath sounds bilaterally, no wheezing, rales,rhonchi or crepitation. No use of accessory muscles of respiration.  CARDIOVASCULAR: S1, S2 normal. No murmurs, rubs, or gallops.  ABDOMEN: Soft, tender in the lower  abdomen,  distended. Bowel sounds present. No organomegaly or mass.  EXTREMITIES: No pedal edema, cyanosis, or clubbing.  NEUROLOGIC: Cranial nerves II through XII are intact. Muscle strength 5/5 in all extremities. Sensation intact. Gait not checked.  PSYCHIATRIC: The patient is alert and oriented x 3.  SKIN: No obvious rash, lesion, or ulcer.    LABORATORY PANEL:   CBC Recent Labs  Lab 12/21/18 0443  WBC 10.4  HGB 8.8*  HCT 27.7*  PLT 329   ------------------------------------------------------------------------------------------------------------------  Chemistries  Recent Labs  Lab 12/14/18 0953  12/21/18 0443  NA 132*   < > 135  K 4.3   < > 3.9  CL 102   < > 103  CO2 24   < > 24  GLUCOSE 110*   < > 108*  BUN 10   < > 12  CREATININE 0.97   < > 0.61  CALCIUM 8.2*   < > 8.6*  AST 52*  --   --   ALT 49*  --   --   ALKPHOS 242*  --   --   BILITOT 1.7*  --   --    < > = values in this interval not displayed.   ------------------------------------------------------------------------------------------------------------------  Cardiac Enzymes No results for input(s): TROPONINI in the last 168 hours. ------------------------------------------------------------------------------------------------------------------  RADIOLOGY:  No results found.  EKG:  No orders found for this or any previous visit.  ASSESSMENT AND PLAN:   PatrickMorrowis a48 y.o.malewith a known history of no medical issues, did not go to a physician for last 2 years and does not take any prescription medicines at home. For last 1 month he also started noticingdirty looking urine which lately converted having stool and gas coming through the  urine. He also had few episodes where he has blood coming through the urine.  1. Symptomatic anemia with hematuria and foul-smelling urine with abnormal CT scan showing suspected bladder mass vs colon mass and Colovesicle fistula -patient is status post  core biopsy of  2.4 cm paraaortic retroperitoneal lymph node CT guided-- by Dr. Kathlene Cote-- biopsy results inconclusive -oncology consultation with Dr. Rogue Bussing -hemoglobin 6.7---s/p  2 unit blood transfusion-- now at 8.8 -appreciate urology and surgical plan.  -s/p cystoscopy and  Biopsy by Dr Erlene Quan on 12/17/2018 -URINARY BLADDER, ANTERIOR WALL; BIOPSY:  -CYSTITIS CYSTICA AND CYSTITIS GLANDULARIS, WITH INTESTINAL METAPLASIA.  - NEGATIVE FOR DYSPLASIA AND MALIGNANCY IN THIS SPECIMEN.  -Surgery to perform flex sig/colonoscopy on Monday - pain meds being adjusted  2. UTI -  po keflex- stop after 5 days -UC  e coli pansensitive  3. 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 -This was noted on CT scan of the abdomen  4. DVT SCDs only Patient is very ambulatory No heparin products for now due to anemia     All the records are reviewed and case discussed with Care Management/Social Workerr. Management plans discussed with the patient, family and they are in agreement.  CODE STATUS: Full Code  TOTAL TIME TAKING CARE OF THIS PATIENT: 38 minutes.   POSSIBLE D/C IN 3-4 DAYS, DEPENDING ON CLINICAL CONDITION.   Gladstone Lighter M.D on 12/21/2018 at 9:42 AM  Between 7am to 6pm - Pager - [phone]  After 6pm go to [email or web address] - password [email or web address] [facility]  Office  [phone]  CC: Primary care physician; Patient, No Pcp Per

## 2018-12-22 ENCOUNTER — Inpatient Hospital Stay: Payer: Self-pay | Admitting: Anesthesiology

## 2018-12-22 ENCOUNTER — Encounter: Payer: Self-pay | Admitting: Student

## 2018-12-22 ENCOUNTER — Encounter
Admission: EM | Disposition: A | Discharge status: Home / Self Care | Payer: Self-pay | Source: Home / Self Care | Attending: Internal Medicine

## 2018-12-22 HISTORY — PX: FLEXIBLE SIGMOIDOSCOPY: SHX5431

## 2018-12-22 LAB — CBC
HCT: 31.2 % — ABNORMAL LOW (ref 39.0–52.0)
Hemoglobin: 9.6 g/dL — ABNORMAL LOW (ref 13.0–17.0)
MCH: 28.9 pg (ref 26.0–34.0)
MCHC: 30.8 g/dL (ref 30.0–36.0)
MCV: 94 fL (ref 80.0–100.0)
Platelets: 382 10*3/uL (ref 150–400)
RBC: 3.32 MIL/uL — ABNORMAL LOW (ref 4.22–5.81)
RDW: 15.9 % — ABNORMAL HIGH (ref 11.5–15.5)
WBC: 14.7 10*3/uL — ABNORMAL HIGH (ref 4.0–10.5)
nRBC: 0 % (ref 0.0–0.2)

## 2018-12-22 LAB — BASIC METABOLIC PANEL
Anion gap: 10 (ref 5–15)
BUN: 13 mg/dL (ref 6–20)
CO2: 23 mmol/L (ref 22–32)
Calcium: 8.7 mg/dL — ABNORMAL LOW (ref 8.9–10.3)
Chloride: 104 mmol/L (ref 98–111)
Creatinine, Ser: 0.71 mg/dL (ref 0.61–1.24)
GFR calc Af Amer: >60 mL/min (ref >60)
GFR calc non Af Amer: >60 mL/min (ref >60)
Glucose, Bld: 100 mg/dL — ABNORMAL HIGH (ref 70–99)
Potassium: 3.8 mmol/L (ref 3.5–5.1)
Sodium: 137 mmol/L (ref 135–145)

## 2018-12-22 SURGERY — SIGMOIDOSCOPY, FLEXIBLE
Anesthesia: General

## 2018-12-22 MED ORDER — ENSURE ENLIVE PO LIQD
237.0000 mL | Freq: Three times a day (TID) | ORAL | Status: DC
  Administered 2018-12-24 – 2018-12-26 (×5): 237 mL via ORAL

## 2018-12-22 MED ORDER — EPHEDRINE SULFATE 50 MG/ML IJ SOLN
INTRAMUSCULAR | Status: DC | PRN
  Administered 2018-12-22 (×3): 5 mg via INTRAVENOUS

## 2018-12-22 MED ORDER — SODIUM CHLORIDE 0.9 % IV SOLN
INTRAVENOUS | Status: DC
  Administered 2018-12-22: 1000 mL via INTRAVENOUS

## 2018-12-22 MED ORDER — PROPOFOL 10 MG/ML IV BOLUS
INTRAVENOUS | Status: DC | PRN
  Administered 2018-12-22 (×9): 20 mg via INTRAVENOUS
  Administered 2018-12-22: 50 mg via INTRAVENOUS
  Administered 2018-12-22 (×3): 20 mg via INTRAVENOUS
  Administered 2018-12-22: 50 mg via INTRAVENOUS
  Administered 2018-12-22 (×7): 20 mg via INTRAVENOUS
  Administered 2018-12-22: 50 mg via INTRAVENOUS
  Administered 2018-12-22 (×2): 20 mg via INTRAVENOUS

## 2018-12-22 MED ORDER — PHENYLEPHRINE HCL 10 MG/ML IJ SOLN
INTRAMUSCULAR | Status: DC | PRN
  Administered 2018-12-22 (×5): 100 ug via INTRAVENOUS

## 2018-12-22 NOTE — Anesthesia Post-op Follow-up Note (Signed)
Anesthesia QCDR form completed.        

## 2018-12-22 NOTE — Progress Notes (Signed)
Subjective:  CC:  Tony Gillespie is a 48 y.o. male  Hospital stay day 8,  colovesicular fistula, unknown etiology  HPI: No acute issues overnight. Took prep but has not had any bowel movements.  Passed couple episodes of painful feculant urine.  Last actual BM was two days ago, normal.  ROS:  General: Denies weight loss, weight gain, fatigue, fevers, chills, and night sweats. Heart: Denies chest pain, palpitations, racing heart, irregular heartbeat, leg pain or swelling, and decreased activity tolerance. Respiratory: Denies breathing difficulty, shortness of breath, wheezing, cough, and sputum. GI: Denies change in appetite, heartburn, nausea, vomiting, constipation, diarrhea, and blood in stool.   Objective:      Temp:  [96.9 F (36.1 C)-98.2 F (36.8 C)] 96.9 F (36.1 C) (02/24 1203) Pulse Rate:  [74-104] 89 (02/24 1203) Resp:  [15-16] 16 (02/24 1203) BP: (102-130)/(63-89) 124/81 (02/24 1203) SpO2:  [95 %-98 %] 98 % (02/24 1203) Weight:  [97.2 kg] 97.2 kg (02/24 1203)     Height: 5\' 10"  (177.8 cm) Weight: 97.2 kg BMI (Calculated): 30.75   Intake/Output this shift:   Intake/Output Summary (Last 24 hours) at 12/22/2018 1219 Last data filed at 12/22/2018 1030 Gross per 24 hour  Intake 2381.69 ml  Output -  Net 2381.69 ml     Constitutional :  alert, cooperative, appears stated age and no distress  Respiratory:  clear to auscultation bilaterally  Cardiovascular:  regular rate and rhythm  Gastrointestinal: soft, non-tender; bowel sounds normal; no masses,  no organomegaly.   Skin: Cool and moist.   Psychiatric: Normal affect, non-agitated, not confused       LABS:  CMP Latest Ref Rng & Units 12/22/2018 12/21/2018 12/15/2018  Glucose 70 - 99 mg/dL 100(H) 108(H) 122(H)  BUN 6 - 20 mg/dL 13 12 12   Creatinine 0.61 - 1.24 mg/dL 0.71 0.61 0.70  Sodium 135 - 145 mmol/L 137 135 136  Potassium 3.5 - 5.1 mmol/L 3.8 3.9 4.0  Chloride 98 - 111 mmol/L 104 103 105  CO2 22 - 32  mmol/L 23 24 25   Calcium 8.9 - 10.3 mg/dL 8.7(L) 8.6(L) 7.9(L)  Total Protein 6.5 - 8.1 g/dL - - -  Total Bilirubin 0.3 - 1.2 mg/dL - - -  Alkaline Phos 38 - 126 U/L - - -  AST 15 - 41 U/L - - -  ALT 0 - 44 U/L - - -   CBC Latest Ref Rng & Units 12/22/2018 12/21/2018 12/18/2018  WBC 4.0 - 10.5 K/uL 14.7(H) 10.4 7.8  Hemoglobin 13.0 - 17.0 g/dL 9.6(L) 8.8(L) 7.2(L)  Hematocrit 39.0 - 52.0 % 31.2(L) 27.7(L) 23.0(L)  Platelets 150 - 400 K/uL 382 329 287    RADS: IR biopsy of retroperitoneal LN, negative for malignancy per verbal report. Assessment:   colovesicular fistula, unknown etiology.  LN negative for malignancy.  S/p cysto and biopsy, benign.  Despite unsuccessful prep, will proceed to see if we can obtain at least a biopsy.  Will also be able to assess if he has obstructing mass that may have caused him to fail his prep.

## 2018-12-22 NOTE — Anesthesia Postprocedure Evaluation (Signed)
Anesthesia Post Note  Patient: Tony Gillespie  Procedure(s) Performed: FLEXIBLE SIGMOIDOSCOPY (N/A )  Patient location during evaluation: Endoscopy Anesthesia Type: General Level of consciousness: awake and alert Pain management: pain level controlled Vital Signs Assessment: post-procedure vital signs reviewed and stable Respiratory status: spontaneous breathing, nonlabored ventilation, respiratory function stable and patient connected to nasal cannula oxygen Cardiovascular status: blood pressure returned to baseline and stable Postop Assessment: no apparent nausea or vomiting Anesthetic complications: no     Last Vitals:  Vitals:   12/22/18 1339 12/22/18 1359  BP: 110/77 100/68  Pulse: 89 90  Resp: (!) 8 18  Temp: (!) 36.3 C   SpO2: 100% 100%    Last Pain:  Vitals:   12/22/18 1359  TempSrc:   PainSc: 0-No pain                 Kinya Meine S

## 2018-12-22 NOTE — Anesthesia Preprocedure Evaluation (Signed)
Anesthesia Evaluation  Patient identified by MRN, date of birth, ID band Patient awake    Reviewed: Allergy & Precautions, H&P , NPO status , Patient's Chart, lab work & pertinent test results  History of Anesthesia Complications Negative for: history of anesthetic complications  Airway Mallampati: III  TM Distance: <3 FB Neck ROM: full    Dental  (+) Chipped   Pulmonary neg pulmonary ROS, neg shortness of breath, former smoker,           Cardiovascular Exercise Tolerance: Good (-) angina(-) Past MI and (-) DOE negative cardio ROS       Neuro/Psych negative neurological ROS  negative psych ROS   GI/Hepatic negative GI ROS, Neg liver ROS, neg GERD  ,  Endo/Other  negative endocrine ROS  Renal/GU negative Renal ROS  negative genitourinary   Musculoskeletal   Abdominal   Peds  Hematology  (+) Blood dyscrasia, anemia ,   Anesthesia Other Findings UTI  Portal vein thrombosis    Past Surgical History: 12/17/2018: CYSTOGRAM; N/A     Comment:  Procedure: CYSTOGRAM;  Surgeon: Hollice Espy, MD;                Location: [facility] ORS;  Service: Urology;  Laterality: N/A; 12/17/2018: CYSTOSCOPY WITH BIOPSY; N/A     Comment:  Procedure: CYSTOSCOPY WITH BLADDER  BIOPSY;  Surgeon:               Hollice Espy, MD;  Location: [facility] ORS;  Service:               Urology;  Laterality: N/A; No date: SPINAL FUSION No date: THORACIC FUSION No date: TONSILLECTOMY  BMI    Body Mass Index:  29.92 kg/m      Reproductive/Obstetrics negative OB ROS                             Anesthesia Physical Anesthesia Plan  ASA: II  Anesthesia Plan: General   Post-op Pain Management:    Induction: Intravenous  PONV Risk Score and Plan: Propofol infusion and TIVA  Airway Management Planned: Natural Airway and Nasal Cannula  Additional Equipment:   Intra-op Plan:   Post-operative Plan:   Informed  Consent: I have reviewed the patients History and Physical, chart, labs and discussed the procedure including the risks, benefits and alternatives for the proposed anesthesia with the patient or authorized representative who has indicated his/her understanding and acceptance.     Dental Advisory Given  Plan Discussed with: Anesthesiologist, CRNA and Surgeon  Anesthesia Plan Comments: (Patient consented for risks of anesthesia including but not limited to:  - adverse reactions to medications - risk of intubation if required - damage to teeth, lips or other oral mucosa - sore throat or hoarseness - Damage to heart, brain, lungs or loss of life  Patient voiced understanding.)        Anesthesia Quick Evaluation

## 2018-12-22 NOTE — Transfer of Care (Signed)
Immediate Anesthesia Transfer of Care Note  Patient: QUINTRELL BAZE  Procedure(s) Performed: FLEXIBLE SIGMOIDOSCOPY (N/A )  Patient Location: Endoscopy Unit  Anesthesia Type:General  Level of Consciousness: drowsy and responds to stimulation  Airway & Oxygen Therapy: Patient Spontanous Breathing and Patient connected to face mask oxygen  Post-op Assessment: Report given to RN and Post -op Vital signs reviewed and stable  Post vital signs: Reviewed and stable  Last Vitals:  Vitals Value Taken Time  BP 110/77 12/22/2018  1:39 PM  Temp 36.3 C 12/22/2018  1:39 PM  Pulse 89 12/22/2018  1:41 PM  Resp 8 12/22/2018  1:41 PM  SpO2 100 % 12/22/2018  1:41 PM  Vitals shown include unvalidated device data.  Last Pain:  Vitals:   12/22/18 1339  TempSrc: Tympanic  PainSc: Asleep      Patients Stated Pain Goal: 4 (44/62/86 3817)  Complications: No apparent anesthesia complications

## 2018-12-22 NOTE — Care Management (Addendum)
Barrier- pain control.  Urinary bladder wall biopsy negative for malignancy. Had flex sigmoid today- biopsies/ removal of polyps. Oncology following.  Patient continues to pass urine through his penis. Patient's care has been transferred to the surgery service. Patient is without payor. Prior to this episode of illness, patient independent in all his adls.  Two recent trips to [place] and [place] and [place] fall 2019. Disposition pending.

## 2018-12-22 NOTE — Progress Notes (Signed)
Nutrition Follow-up  DOCUMENTATION CODES:   Severe malnutrition in context of acute illness/injury  INTERVENTION:  Once diet advances, resume Ensure Enlive po TID, each supplement provides 350 kcal and 20 grams of protein.   NUTRITION DIAGNOSIS:   Severe Malnutrition related to acute illness(unknown etiology, diarrhea, colovesical fistula, inadequate oral intake x 1 month) as evidenced by moderate fat depletion, moderate muscle depletion, severe muscle depletion, percent weight loss.  Ongoing.  GOAL:   Patient will meet greater than or equal to 90% of their needs  Progressing.  MONITOR:   PO intake, Supplement acceptance, Labs, Weight trends, I & O's  REASON FOR ASSESSMENT:   Malnutrition Screening Tool    ASSESSMENT:   48 year old male with no significant PMHx who recently traveled to [place] and [place] in 10/2018, developed diarrhea and fecal urea found to have colovesical fistula and retroperitoneal adenopathy who is currently undergoing work-up.   -Biopsy results from retroperitoneal lymph node on 2/17 favors reactive process. No lymphoproliferative process or metastatic malignancy identified. -Patient underwent cystoscopy, cystogram, and bladder biopsy on 2/19. -Bladder biopsy shows cystitis cystica and cystitis glandularis with intestinal metaplasia. Negative for dysplasia and malignancy.   Patient down for sigmoidoscopy today. Diet was changed to clear liquids on 2/23 in AM for patient to prep. Ensure was discontinued at that time. Prior to this patient was consuming 80-100% of meals and drinking Ensure.   Medications reviewed and include: NS @ 20 mL/hr, LR @ 100 mL/hr.  Labs reviewed.  Discussed with RN.  Diet Order:   Diet Order            Diet NPO time specified  Diet effective now             EDUCATION NEEDS:   Education needs have been addressed  Skin:  Skin Assessment: Reviewed RN Assessment  Last BM:  12/15/2018 per chart  Height:   Ht  Readings from Last 1 Encounters:  12/22/18 5\' 10"  (1.778 m)   Weight:   Wt Readings from Last 1 Encounters:  12/22/18 97.2 kg   Ideal Body Weight:  78.2 kg  BMI:  Body mass index is 30.75 kg/m.  Estimated Nutritional Needs:   Kcal:  2095-2420 (MSJ x 1.3-1.5)  Protein:  105-120 grams (1.5-1.7 grams/kg)  Fluid:  2.1-2.5 L/day (30-35 mL/kg)  Willey Blade, MS, RD, LDN Office: [phone] Pager: [phone] After Hours/Weekend Pager: [phone]

## 2018-12-22 NOTE — Progress Notes (Signed)
Discussed with Dr. Lysle Pearl, will change patient to surgical service. I will be available all week, please call if any concerns or questions.

## 2018-12-22 NOTE — Op Note (Signed)
[facility] Gastroenterology Patient Name: Tony Gillespie Procedure Date: 12/22/2018 12:06 PM MRN: [id number] Account #: [id number] Date of Birth: 1971-06-01 Admit Type: Inpatient Age: 48 Room: [facility] ENDO ROOM 3 Gender: Male Note Status: Finalized Procedure:            Flexible Sigmoidoscopy Indications:          Abnormal CT of the GI tract Providers:            Eliseo Squires MD, MD Medicines:            Propofol per Anesthesia Complications:        No immediate complications. Procedure:            Pre-Anesthesia Assessment:                       - After reviewing the risks and benefits, the patient                        was deemed in satisfactory condition to undergo the                        procedure.                       - Using IV propofol under the supervision of an                        anesthesiologist was determined to be medically                        necessary for this procedure based on review of the                        patient's medical history, medications, and prior                        anesthesia history.                       After obtaining informed consent, the scope was passed                        under direct vision. The Endoscope was introduced                        through the anus and advanced to the the sigmoid colon.                        The flexible sigmoidoscopy was technically difficult                        and complex due to poor bowel prep with stool present.                        Successful completion of the procedure was aided by                        lavage. Findings:      The perianal and digital rectal examinations were normal.      A few small and large-mouthed diverticula were found in the distal  sigmoid colon.      Two semi-sessile, non-bleeding polyps were found in the distal sigmoid       colon. The polyps were 20 mm in size. These polyps were biopsied with a       cold snare. Polyp resection was  incomplete. The resected tissue was       retrieved. Impression:           - Diverticulosis in the distal sigmoid colon.                       - Two 20 mm, non-bleeding polyps in the distal sigmoid                        colon, removed with a cold snare. Incomplete resection.                        Resected tissue retrieved.                       - Mild diverticulosis in the distal sigmoid colon.                        There was no evidence of diverticular bleeding.                       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 Recommendation:       - Return patient to floor for ongoing care. Procedure Code(s):    --- Professional ---                       [phone], Sigmoidoscopy, flexible; with removal of                        tumor(s), polyp(s), or other lesion(s) by snare                        technique CPT copyright 2018 American [employer] Association. All rights reserved. The codes documented in this report are preliminary and upon coder review may  be revised to meet current compliance requirements. Dr. Sheppard Penton, MD Eliseo Squires MD, MD 12/22/2018 1:55:46 PM This report has been signed electronically. Number of Addenda: 0 Note Initiated On: 12/22/2018 12:06 PM Total Procedure Duration: 1 hour 2 minutes 13 seconds       [facility]

## 2018-12-22 NOTE — Progress Notes (Signed)
[facility] [place] NAME: Tony Gillespie    MR#:  [id number]  DATE OF BIRTH:  [birth year]  SUBJECTIVE:  CHIEF COMPLAINT:   Chief Complaint  Patient presents with  . Urinary Tract Infection   - for flex sigmoidoscopy today.  Took the bowel prep but no bowel movements noted today. -No new complaints  REVIEW OF SYSTEMS:  Review of Systems  Constitutional: Negative for chills, fever and malaise/fatigue.  HENT: Negative for congestion, ear discharge, hearing loss and nosebleeds.   Eyes: Negative for blurred vision and double vision.  Respiratory: Negative for cough, shortness of breath and wheezing.   Cardiovascular: Negative for chest pain, palpitations and leg swelling.  Gastrointestinal: Positive for abdominal pain. Negative for constipation, diarrhea, nausea and vomiting.  Genitourinary: Positive for dysuria.  Musculoskeletal: Positive for myalgias.  Neurological: Negative for dizziness, focal weakness, seizures, weakness and headaches.  Psychiatric/Behavioral: Negative for depression.    DRUG ALLERGIES:   Allergies  Allergen Reactions  . Erythromycin Nausea And Vomiting    VITALS:  Blood pressure 124/81, pulse 89, temperature (!) 96.9 F (36.1 C), temperature source Tympanic, resp. rate 16, height 5\' 10"  (1.778 m), weight 97.2 kg, SpO2 98 %.  PHYSICAL EXAMINATION:  Physical Exam  GENERAL:  48 y.o.-year-old patient lying in the bed with no acute distress.  EYES: Pupils equal, round, reactive to light and accommodation. No scleral icterus. Extraocular muscles intact.  HEENT: Head atraumatic, normocephalic. Oropharynx and nasopharynx clear.  NECK:  Supple, no jugular venous distention. No thyroid enlargement, no tenderness.  LUNGS: Normal breath sounds bilaterally, no wheezing, rales,rhonchi or crepitation. No use of accessory muscles of respiration.  CARDIOVASCULAR: S1, S2 normal. No murmurs, rubs, or gallops.  ABDOMEN:  Soft, nontender,  distended. Bowel sounds present. No organomegaly or mass.  EXTREMITIES: No pedal edema, cyanosis, or clubbing.  NEUROLOGIC: Cranial nerves II through XII are intact. Muscle strength 5/5 in all extremities. Sensation intact. Gait not checked.  PSYCHIATRIC: The patient is alert and oriented x 3.  SKIN: No obvious rash, lesion, or ulcer.    LABORATORY PANEL:   CBC Recent Labs  Lab 12/22/18 0649  WBC 14.7*  HGB 9.6*  HCT 31.2*  PLT 382   ------------------------------------------------------------------------------------------------------------------  Chemistries  Recent Labs  Lab 12/22/18 0649  NA 137  K 3.8  CL 104  CO2 23  GLUCOSE 100*  BUN 13  CREATININE 0.71  CALCIUM 8.7*   ------------------------------------------------------------------------------------------------------------------  Cardiac Enzymes No results for input(s): TROPONINI in the last 168 hours. ------------------------------------------------------------------------------------------------------------------  RADIOLOGY:  No results found.  EKG:  No orders found for this or any previous visit.  ASSESSMENT AND PLAN:   PatrickMorrowis a48 y.o.malewith a known history of no medical issues, did not go to a physician for last 2 years and does not take any prescription medicines at home. For last 1 month he also started noticingdirty looking urine which lately converted having stool and gas coming through the urine. He also had few episodes where he has blood coming through the urine.  1. Symptomatic anemia with hematuria and foul-smelling urine with abnormal CT scan showing suspected bladder mass vs colon mass and Colovesicle fistula -patient is status post core biopsy of  2.4 cm paraaortic retroperitoneal lymph node CT guided-- by Dr. Kathlene Cote-- biopsy results inconclusive -oncology consultation with Dr. Rogue Bussing -hemoglobin 6.7---s/p  2 unit blood transfusion-- now at  8.8 -appreciate urology and surgical plan.  -s/p cystoscopy and  Biopsy by Dr Erlene Quan  on 12/17/2018 -URINARY BLADDER, ANTERIOR WALL; BIOPSY:  -CYSTITIS CYSTICA AND CYSTITIS GLANDULARIS, WITH INTESTINAL METAPLASIA.  - NEGATIVE FOR DYSPLASIA AND MALIGNANCY IN THIS SPECIMEN.  -Surgery to perform flex sig/colonoscopy today and biopsies -CEA within normal limits - pain meds being adjusted  2. UTI -  po keflex-finished treatment -UC  e coli pansensitive  3. 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 -This was noted on CT scan of the abdomen  4. DVT SCDs only Patient is very ambulatory No heparin products for now due to anemia     All the records are reviewed and case discussed with Care Management/Social Workerr. Management plans discussed with the patient, family and they are in agreement.  CODE STATUS: Full Code  TOTAL TIME TAKING CARE OF THIS PATIENT: 31 minutes.   POSSIBLE D/C IN 3-4 DAYS, DEPENDING ON CLINICAL CONDITION.   Gladstone Lighter M.D on 12/22/2018 at 1:03 PM  Between 7am to 6pm - Pager - [phone]  After 6pm go to [email or web address] - password [email or web address] [facility]  Office  [phone]  CC: Primary care physician; Patient, No Pcp Per

## 2018-12-23 ENCOUNTER — Inpatient Hospital Stay: Payer: Self-pay

## 2018-12-23 ENCOUNTER — Encounter: Payer: Self-pay | Admitting: Radiology

## 2018-12-23 DIAGNOSIS — R140 Abdominal distension (gaseous): Secondary | ICD-10-CM

## 2018-12-23 LAB — BASIC METABOLIC PANEL
Anion gap: 10 (ref 5–15)
BUN: 16 mg/dL (ref 6–20)
CHLORIDE: 103 mmol/L (ref 98–111)
CO2: 25 mmol/L (ref 22–32)
Calcium: 8.6 mg/dL — ABNORMAL LOW (ref 8.9–10.3)
Creatinine, Ser: 0.88 mg/dL (ref 0.61–1.24)
GFR calc Af Amer: >60 mL/min (ref >60)
GFR calc non Af Amer: >60 mL/min (ref >60)
Glucose, Bld: 101 mg/dL — ABNORMAL HIGH (ref 70–99)
Potassium: 3.6 mmol/L (ref 3.5–5.1)
Sodium: 138 mmol/L (ref 135–145)

## 2018-12-23 LAB — CBC WITH DIFFERENTIAL/PLATELET
Abs Immature Granulocytes: 0.14 10*3/uL — ABNORMAL HIGH (ref 0.00–0.07)
Basophils Absolute: 0.1 10*3/uL (ref 0.0–0.1)
Basophils Relative: 1 %
Eosinophils Absolute: 0 10*3/uL (ref 0.0–0.5)
Eosinophils Relative: 0 %
HCT: 37.2 % — ABNORMAL LOW (ref 39.0–52.0)
HEMOGLOBIN: 11.5 g/dL — AB (ref 13.0–17.0)
Immature Granulocytes: 1 %
Lymphocytes Relative: 9 %
Lymphs Abs: 1.6 10*3/uL (ref 0.7–4.0)
MCH: 28.6 pg (ref 26.0–34.0)
MCHC: 30.9 g/dL (ref 30.0–36.0)
MCV: 92.5 fL (ref 80.0–100.0)
MONO ABS: 0.9 10*3/uL (ref 0.1–1.0)
Monocytes Relative: 5 %
Neutro Abs: 15.8 10*3/uL — ABNORMAL HIGH (ref 1.7–7.7)
Neutrophils Relative %: 84 %
Platelets: 454 10*3/uL — ABNORMAL HIGH (ref 150–400)
RBC: 4.02 MIL/uL — AB (ref 4.22–5.81)
RDW: 15.8 % — ABNORMAL HIGH (ref 11.5–15.5)
WBC: 18.6 10*3/uL — ABNORMAL HIGH (ref 4.0–10.5)
nRBC: 0 % (ref 0.0–0.2)

## 2018-12-23 LAB — MAGNESIUM: MAGNESIUM: 1.9 mg/dL (ref 1.7–2.4)

## 2018-12-23 LAB — SURGICAL PATHOLOGY

## 2018-12-23 LAB — PHOSPHORUS: Phosphorus: 4.9 mg/dL — ABNORMAL HIGH (ref 2.5–4.6)

## 2018-12-23 MED ORDER — IOHEXOL 350 MG/ML SOLN
75.0000 mL | Freq: Once | INTRAVENOUS | Status: AC | PRN
  Administered 2018-12-23: 75 mL via INTRAVENOUS

## 2018-12-23 MED ORDER — ACETAMINOPHEN 325 MG PO TABS
650.0000 mg | ORAL_TABLET | Freq: Four times a day (QID) | ORAL | Status: DC
  Administered 2018-12-23 – 2018-12-26 (×6): 650 mg via ORAL
  Filled 2018-12-23 (×8): qty 2

## 2018-12-23 MED ORDER — ONDANSETRON HCL 4 MG/2ML IJ SOLN: 4.0000 mg | Freq: Four times a day (QID) | INTRAMUSCULAR | Status: DC | PRN

## 2018-12-23 MED ORDER — LACTATED RINGERS IV SOLN
INTRAVENOUS | Status: DC
  Administered 2018-12-23 – 2018-12-25 (×4): via INTRAVENOUS

## 2018-12-23 MED ORDER — PHENOL 1.4 % MT LIQD
1.0000 | OROMUCOSAL | Status: DC | PRN
  Filled 2018-12-23: qty 177

## 2018-12-23 MED ORDER — ENOXAPARIN SODIUM 40 MG/0.4ML ~~LOC~~ SOLN
40.0000 mg | SUBCUTANEOUS | Status: DC
  Administered 2018-12-23 – 2018-12-24 (×2): 40 mg via SUBCUTANEOUS
  Filled 2018-12-23 (×2): qty 0.4

## 2018-12-23 MED ORDER — SIMETHICONE 80 MG PO CHEW
80.0000 mg | CHEWABLE_TABLET | Freq: Four times a day (QID) | ORAL | Status: DC | PRN
  Filled 2018-12-23: qty 1

## 2018-12-23 MED ORDER — KETOROLAC TROMETHAMINE 30 MG/ML IJ SOLN
30.0000 mg | Freq: Three times a day (TID) | INTRAMUSCULAR | Status: DC | PRN
  Administered 2018-12-23 – 2018-12-26 (×8): 30 mg via INTRAVENOUS
  Filled 2018-12-23 (×8): qty 1

## 2018-12-23 NOTE — Progress Notes (Signed)
Subjective:  CC:  Tony Gillespie is a 48 y.o. male  Hospital stay day 9,  colovesicular fistula, unknown etiology.   HPI: Pt continues to pass small amounts of stool through rectum and endorses flatus, but still remains very distended and uncomfortable despite NG placement.   Continues to have feculent urine as well.   ROS:  General: Denies weight loss, weight gain, fatigue, fevers, chills, and night sweats. Heart: Denies chest pain, palpitations, racing heart, irregular heartbeat, leg pain or swelling, and decreased activity tolerance. Respiratory: Denies breathing difficulty, shortness of breath, wheezing, cough, and sputum. GI: Denies change in appetite, heartburn, nausea, vomiting, constipation, diarrhea, and blood in stool.   Objective:      Temp:  [97.8 F (36.6 C)-98.1 F (36.7 C)] 98.1 F (36.7 C) (02/25 1504) Pulse Rate:  [92-110] 110 (02/25 1504) Resp:  [15-20] 16 (02/25 1504) BP: (126-153)/(79-103) 153/103 (02/25 1504) SpO2:  [95 %-100 %] 96 % (02/25 1504)     Height: 5\' 10"  (177.8 cm) Weight: 97.2 kg BMI (Calculated): 30.75   Intake/Output this shift:   Intake/Output Summary (Last 24 hours) at 12/23/2018 1515 Last data filed at 12/22/2018 1649 Gross per 24 hour  Intake 43 ml  Output -  Net 43 ml     Constitutional :  alert, cooperative, appears stated age and no distress  Respiratory:  clear to auscultation bilaterally  Cardiovascular:  regular rate and rhythm  Gastrointestinal: soft, no guarding, but increased distention with tenderness in all four quadrants.   Skin: Cool and moist.   Psychiatric: Normal affect, non-agitated, not confused       LABS:  CMP Latest Ref Rng & Units 12/23/2018 12/22/2018 12/21/2018  Glucose 70 - 99 mg/dL 101(H) 100(H) 108(H)  BUN 6 - 20 mg/dL 16 13 12   Creatinine 0.61 - 1.24 mg/dL 0.88 0.71 0.61  Sodium 135 - 145 mmol/L 138 137 135  Potassium 3.5 - 5.1 mmol/L 3.6 3.8 3.9  Chloride 98 - 111 mmol/L 103 104 103  CO2 22 - 32  mmol/L 25 23 24   Calcium 8.9 - 10.3 mg/dL 8.6(L) 8.7(L) 8.6(L)  Total Protein 6.5 - 8.1 g/dL - - -  Total Bilirubin 0.3 - 1.2 mg/dL - - -  Alkaline Phos 38 - 126 U/L - - -  AST 15 - 41 U/L - - -  ALT 0 - 44 U/L - - -   CBC Latest Ref Rng & Units 12/23/2018 12/22/2018 12/21/2018  WBC 4.0 - 10.5 K/uL 18.6(H) 14.7(H) 10.4  Hemoglobin 13.0 - 17.0 g/dL 11.5(L) 9.6(L) 8.8(L)  Hematocrit 39.0 - 52.0 % 37.2(L) 31.2(L) 27.7(L)  Platelets 150 - 400 K/uL 454(H) 382 329    RADS: CLINICAL DATA:  Abdominal distension following sigmoidoscopy.  EXAM: CT ABDOMEN AND PELVIS WITH CONTRAST  TECHNIQUE: Multidetector CT imaging of the abdomen and pelvis was performed using the standard protocol following bolus administration of intravenous contrast.  CONTRAST:  42mL OMNIPAQUE IOHEXOL 350 MG/ML SOLN  COMPARISON:  CT scan 12/14/2018  FINDINGS: Lower chest: Streaky bibasilar atelectasis. No pleural effusions or worrisome pulmonary lesions. The heart is normal in size. No pericardial effusion. Three-vessel coronary artery calcifications are noted and are quite advanced for age.  Hepatobiliary: Extensive portal vein thrombosis is again demonstrated. The main portal vein is patent and the splenic vein is patent. Portal venous collaterals are noted and early cavernous transformation. No focal hepatic lesions.  Pancreas: No mass, inflammation or ductal dilatation. Small duodenal diverticulum noted near the pancreatic head.  Spleen: Stable mild splenomegaly and small defect in the upper anterior aspect of the spleen which could be splenic vein thrombosis or small splenic infarct.  Adrenals/Urinary Tract: The adrenal glands and kidneys are unremarkable and stable.  There is a large amount of air in the bladder and the dome of the bladder is markedly thick walled and irregular. There is a colovesical fistula. Which is coming off the sigmoid colon. Measures approximately 4.5 x 3.7  cm.  Stomach/Bowel: The stomach is unremarkable. The duodenum appears normal. Dilated fluid-filled loops of small bowel with air-fluid levels and dilated colon with fluid and air-fluid levels. Findings suggest a diffuse ileus. There is also fairly marked inflammation and enhancement of the sigmoid colon near the colovesical fistula. No colonic mass is identified.  Vascular/Lymphatic: Stable significant age advanced atherosclerotic calcifications involving the aorta and iliac arteries. No aneurysm. The branch vessels are patent. The major venous structures are patent except for the intrahepatic portal veins.  Scattered retroperitoneal lymph nodes are again demonstrated.  Reproductive: The prostate gland and seminal vesicles are unremarkable. Perirectal collateral vessels are noted.  Other: Moderate abdominal ascites and diffuse mesenteric edema along with body wall edema.  Musculoskeletal: No significant bony findings.  IMPRESSION: 1. Large colovesical fistula and associated marked inflammation of the sigmoid colon and bladder dome. 2. Distended small bowel and colon with air-fluid levels suggesting a diffuse ileus. No findings for colonic obstruction. 3. Persistent extensive intrahepatic portal vein thrombosis. 4. Mesenteric edema, ascites and body wall edema. 5. Advanced atherosclerotic calcifications involving the aorta and coronary arteries for age.   Electronically Signed   By: Marijo Sanes M.D.   On: 12/23/2018 07:49  Assessment:   In summary: 48 year old male no significant medical history presenting with pyuria.  Work-up noted a colovesicular fistula of unknown origin.  Treated with IV and oral antibiotics for his obvious UTI while being worked up for the possible cause of this fistula.  Initially underwent CT-guided biopsy of a abnormal lymph node noted on the CT scan.this came back as inflammatory and no signs of malignancy.  Next, a cystoscopy due to the  CT read concerning for possible bladder mass performed.  Cystoscopy grossly only showed some inflamed tissue and food debris but no obvious mass.  With biopsy results showing all benign tissue.  He has a history of diverticulosis noted on a previous colonoscopy back in 2014 with tubular adenoma completely resected.  He underwent a flex sig with a pedunculated tissue that came back as benign tissue.  The sigmoidoscopy could not be advanced beyond this pedunculated piece of tissue at 45 cm but there is no guarantee that was the point of the fistula formation.  Of note, patient did not have any significant bowel movements during the prep phase and was noted to be persistently distended and uncomfortable postprocedure as well, CT scan was ordered again by the covering hospitalist at night which showed possibility of ileus now.  Currently has NG tube in place and stable, but due to the possibility this still may be a malignant process causing the colovesicular fistula and now the possible obstruction, decision was made that patient be transferred to a tertiary care center.  The urologist that has been involved in the recommended to have bladder resection, if needed, to be completed outside of this hospital.  [facility] transfer center notified and willing to accept once bed is available.  Will try again in a couple days to see if bed opens.  Continue current care in  the meantime.

## 2018-12-23 NOTE — Progress Notes (Signed)
Tony Gillespie   DOB:[birth year]   ZY#:[id number]    Subjective: Patient notes to abdominal distention.  Abdominal discomfort.  Still passing stool in urine.  No fevers.  Had a CT scan of the abdomen pelvis this morning.  Patient had flexible sigmoidoscopy yesterday.  Objective:  Vitals:   12/23/18 0443 12/23/18 1504  BP: (!) 144/99 (!) 153/103  Pulse: (!) 104 (!) 110  Resp: 15 16  Temp: 97.8 F (36.6 C) 98.1 F (36.7 C)  SpO2: 95% 96%    No intake or output data in the 24 hours ending 12/23/18 1710  Physical Exam  Constitutional: He is oriented to person, place, and time.  Thin built male patient.  He is resting in the bed.  Complains of abdominal distention.  HENT:  Head: Normocephalic and atraumatic.  Mouth/Throat: Oropharynx is clear and moist. No oropharyngeal exudate.  Eyes: Pupils are equal, round, and reactive to light.  Neck: Normal range of motion. Neck supple.  Cardiovascular: Normal rate and regular rhythm.  Pulmonary/Chest: Breath sounds normal. No respiratory distress. He has no wheezes.  Abdominal: Soft. Bowel sounds are normal. He exhibits distension. He exhibits no mass. There is abdominal tenderness. There is no rebound and no guarding.  Musculoskeletal: Normal range of motion.        General: No tenderness or edema.  Neurological: He is alert and oriented to person, place, and time.  Skin: Skin is warm.  Psychiatric: Affect normal.       Labs:  Lab Results  Component Value Date   WBC 18.6 (H) 12/23/2018   HGB 11.5 (L) 12/23/2018   HCT 37.2 (L) 12/23/2018   MCV 92.5 12/23/2018   PLT 454 (H) 12/23/2018   NEUTROABS 15.8 (H) 12/23/2018    Lab Results  Component Value Date   NA 138 12/23/2018   K 3.6 12/23/2018   CL 103 12/23/2018   CO2 25 12/23/2018    Studies:  Dg Abd 1 View  Result Date: 12/23/2018 CLINICAL DATA:  Nasogastric tube placement. EXAM: ABDOMEN - 1 VIEW COMPARISON:  None. FINDINGS: Distal tip of nasogastric tube is seen in  expected position of gastroesophageal junction. Mildly dilated small bowel loops are noted concerning for distal small bowel obstruction. Colonic dilatation is noted as well. IMPRESSION: Distal tip of nasogastric tube seen in expected position of gastroesophageal junction; advancement is recommended. Small and large bowel dilatation is noted concerning for ileus or obstruction. Electronically Signed   By: Marijo Conception, M.D.   On: 12/23/2018 10:36   Ct Abdomen Pelvis W Contrast  Result Date: 12/23/2018 CLINICAL DATA:  Abdominal distension following sigmoidoscopy. EXAM: CT ABDOMEN AND PELVIS WITH CONTRAST TECHNIQUE: Multidetector CT imaging of the abdomen and pelvis was performed using the standard protocol following bolus administration of intravenous contrast. CONTRAST:  12mL OMNIPAQUE IOHEXOL 350 MG/ML SOLN COMPARISON:  CT scan 12/14/2018 FINDINGS: Lower chest: Streaky bibasilar atelectasis. No pleural effusions or worrisome pulmonary lesions. The heart is normal in size. No pericardial effusion. Three-vessel coronary artery calcifications are noted and are quite advanced for age. Hepatobiliary: Extensive portal vein thrombosis is again demonstrated. The main portal vein is patent and the splenic vein is patent. Portal venous collaterals are noted and early cavernous transformation. No focal hepatic lesions. Pancreas: No mass, inflammation or ductal dilatation. Small duodenal diverticulum noted near the pancreatic head. Spleen: Stable mild splenomegaly and small defect in the upper anterior aspect of the spleen which could be splenic vein thrombosis or small splenic infarct. Adrenals/Urinary  Tract: The adrenal glands and kidneys are unremarkable and stable. There is a large amount of air in the bladder and the dome of the bladder is markedly thick walled and irregular. There is a colovesical fistula. Which is coming off the sigmoid colon. Measures approximately 4.5 x 3.7 cm. Stomach/Bowel: The stomach is  unremarkable. The duodenum appears normal. Dilated fluid-filled loops of small bowel with air-fluid levels and dilated colon with fluid and air-fluid levels. Findings suggest a diffuse ileus. There is also fairly marked inflammation and enhancement of the sigmoid colon near the colovesical fistula. No colonic mass is identified. Vascular/Lymphatic: Stable significant age advanced atherosclerotic calcifications involving the aorta and iliac arteries. No aneurysm. The branch vessels are patent. The major venous structures are patent except for the intrahepatic portal veins. Scattered retroperitoneal lymph nodes are again demonstrated. Reproductive: The prostate gland and seminal vesicles are unremarkable. Perirectal collateral vessels are noted. Other: Moderate abdominal ascites and diffuse mesenteric edema along with body wall edema. Musculoskeletal: No significant bony findings. IMPRESSION: 1. Large colovesical fistula and associated marked inflammation of the sigmoid colon and bladder dome. 2. Distended small bowel and colon with air-fluid levels suggesting a diffuse ileus. No findings for colonic obstruction. 3. Persistent extensive intrahepatic portal vein thrombosis. 4. Mesenteric edema, ascites and body wall edema. 5. Advanced atherosclerotic calcifications involving the aorta and coronary arteries for age. Electronically Signed   By: Marijo Sanes M.D.   On: 12/23/2018 07:49   Dg Abd Portable 1v  Result Date: 12/23/2018 CLINICAL DATA:  48 year old male with nasogastric tube placement. Subsequent encounter. EXAM: PORTABLE ABDOMEN - 1 VIEW COMPARISON:  12/23/2018 10:10 a.m. FINDINGS: Nasogastric tube has been advanced. Side hole just beyond the gastroesophageal junction. Tip fundus-body junction. Gas and fluid-filled stomach. Gas distended small and large bowel suggestive of obstruction. No obvious free air. Postsurgical changes project over the lower thoracic spine. Left base atelectasis. Residual contrast  in right kidney. IMPRESSION: 1. Nasogastric tube has been advanced. Side hole just beyond the gastroesophageal junction. Tip gastric fundus-body junction. 2. Gas and fluid-filled stomach. Gas distended small and large bowel suggestive of obstruction. No obvious free air. Electronically Signed   By: Genia Del M.D.   On: 12/23/2018 11:41    Colo-vesical fistula #48 year old male patient currently admitted to hospital for passing feculent matter in urine/CT scan abdomen pelvis concerning for colovesical fistula/also portal vein thrombosis  # Colovesical fistula/with retroperitoneal adenopathy-highly concerning for malignancy.   CT-guided biopsy of the retroperitoneal lymph node-NEG for malignancy.  Bladder biopsy negative for malignancy.  Also-flex sig positive for polyp-no obvious evidence of malignancy.   #Abdominal distention-CT scan shows ileus/colonic obstruction.  NG tube as per surgery service.  #Severe anemia at presentation hemoglobin today 11.5.  #Portal vein thrombosis-asymptomatic at this time.sec possible underlying malignancy; hold off anticoagulation at this time.  #UTI-/leukocytosis gram-negative secondary to colovesical fistula on Zosyn.   #Defer to surgery for surgical options at this time.  Medical oncology will follow.    Cammie Sickle, MD

## 2018-12-23 NOTE — Plan of Care (Signed)
  Problem: Education: Goal: Knowledge of General Education information will improve Description Including pain rating scale, medication(s)/side effects and non-pharmacologic comfort measures Outcome: Progressing   Problem: Health Behavior/Discharge Planning: Goal: Ability to manage health-related needs will improve Outcome: Progressing   Problem: Clinical Measurements: Goal: Ability to maintain clinical measurements within normal limits will improve Outcome: Progressing Goal: Will remain free from infection Outcome: Progressing Goal: Diagnostic test results will improve Outcome: Progressing Goal: Respiratory complications will improve Outcome: Progressing Goal: Cardiovascular complication will be avoided Outcome: Progressing   Problem: Activity: Goal: Risk for activity intolerance will decrease Outcome: Progressing   Problem: Nutrition: Goal: Adequate nutrition will be maintained Outcome: Progressing   Problem: Coping: Goal: Level of anxiety will decrease Outcome: Progressing   Problem: Elimination: Goal: Will not experience complications related to bowel motility Outcome: Progressing Goal: Will not experience complications related to urinary retention Outcome: Progressing   Problem: Safety: Goal: Ability to remain free from injury will improve Outcome: Progressing   Problem: Skin Integrity: Goal: Risk for impaired skin integrity will decrease Outcome: Progressing   Problem: Spiritual Needs Goal: Ability to function at adequate level Outcome: Progressing

## 2018-12-23 NOTE — Care Management (Signed)
Patient has developed ileus and is now npo with nasogastric tube to low intermittent suction.  There is discussion of transfer to tertiary facility due to potential need for bladder resection which can not be performed at [facility].

## 2018-12-23 NOTE — Progress Notes (Signed)
Subjective:  CC:  Tony Gillespie is a 48 y.o. male  Hospital stay day 9,  colovesicular fistula, unknown etiology.   HPI: Pt continues to pass small amounts of stool through rectum and endorses flatus, but still remains very distended and uncomfortable.   Continues to have feculent urine as well.   ROS:  General: Denies weight loss, weight gain, fatigue, fevers, chills, and night sweats. Heart: Denies chest pain, palpitations, racing heart, irregular heartbeat, leg pain or swelling, and decreased activity tolerance. Respiratory: Denies breathing difficulty, shortness of breath, wheezing, cough, and sputum. GI: Denies change in appetite, heartburn, nausea, vomiting, constipation, diarrhea, and blood in stool.   Objective:      Temp:  [96.9 F (36.1 C)-98 F (36.7 C)] 97.8 F (36.6 C) (02/25 0443) Pulse Rate:  [89-104] 104 (02/25 0443) Resp:  [8-20] 15 (02/25 0443) BP: (100-144)/(68-99) 144/99 (02/25 0443) SpO2:  [95 %-100 %] 95 % (02/25 0443) Weight:  [97.2 kg] 97.2 kg (02/24 1203)     Height: 5\' 10"  (177.8 cm) Weight: 97.2 kg BMI (Calculated): 30.75   Intake/Output this shift:   Intake/Output Summary (Last 24 hours) at 12/23/2018 1017 Last data filed at 12/22/2018 1649 Gross per 24 hour  Intake 1715.09 ml  Output -  Net 1715.09 ml     Constitutional :  alert, cooperative, appears stated age and no distress  Respiratory:  clear to auscultation bilaterally  Cardiovascular:  regular rate and rhythm  Gastrointestinal: soft, no guarding, but increased distention with tenderness in all four quadrants.   Skin: Cool and moist.   Psychiatric: Normal affect, non-agitated, not confused       LABS:  CMP Latest Ref Rng & Units 12/23/2018 12/22/2018 12/21/2018  Glucose 70 - 99 mg/dL 101(H) 100(H) 108(H)  BUN 6 - 20 mg/dL 16 13 12   Creatinine 0.61 - 1.24 mg/dL 0.88 0.71 0.61  Sodium 135 - 145 mmol/L 138 137 135  Potassium 3.5 - 5.1 mmol/L 3.6 3.8 3.9  Chloride 98 - 111 mmol/L 103  104 103  CO2 22 - 32 mmol/L 25 23 24   Calcium 8.9 - 10.3 mg/dL 8.6(L) 8.7(L) 8.6(L)  Total Protein 6.5 - 8.1 g/dL - - -  Total Bilirubin 0.3 - 1.2 mg/dL - - -  Alkaline Phos 38 - 126 U/L - - -  AST 15 - 41 U/L - - -  ALT 0 - 44 U/L - - -   CBC Latest Ref Rng & Units 12/23/2018 12/22/2018 12/21/2018  WBC 4.0 - 10.5 K/uL 18.6(H) 14.7(H) 10.4  Hemoglobin 13.0 - 17.0 g/dL 11.5(L) 9.6(L) 8.8(L)  Hematocrit 39.0 - 52.0 % 37.2(L) 31.2(L) 27.7(L)  Platelets 150 - 400 K/uL 454(H) 382 329    RADS: CLINICAL DATA:  Abdominal distension following sigmoidoscopy.  EXAM: CT ABDOMEN AND PELVIS WITH CONTRAST  TECHNIQUE: Multidetector CT imaging of the abdomen and pelvis was performed using the standard protocol following bolus administration of intravenous contrast.  CONTRAST:  7mL OMNIPAQUE IOHEXOL 350 MG/ML SOLN  COMPARISON:  CT scan 12/14/2018  FINDINGS: Lower chest: Streaky bibasilar atelectasis. No pleural effusions or worrisome pulmonary lesions. The heart is normal in size. No pericardial effusion. Three-vessel coronary artery calcifications are noted and are quite advanced for age.  Hepatobiliary: Extensive portal vein thrombosis is again demonstrated. The main portal vein is patent and the splenic vein is patent. Portal venous collaterals are noted and early cavernous transformation. No focal hepatic lesions.  Pancreas: No mass, inflammation or ductal dilatation. Small duodenal diverticulum noted  near the pancreatic head.  Spleen: Stable mild splenomegaly and small defect in the upper anterior aspect of the spleen which could be splenic vein thrombosis or small splenic infarct.  Adrenals/Urinary Tract: The adrenal glands and kidneys are unremarkable and stable.  There is a large amount of air in the bladder and the dome of the bladder is markedly thick walled and irregular. There is a colovesical fistula. Which is coming off the sigmoid colon.  Measures approximately 4.5 x 3.7 cm.  Stomach/Bowel: The stomach is unremarkable. The duodenum appears normal. Dilated fluid-filled loops of small bowel with air-fluid levels and dilated colon with fluid and air-fluid levels. Findings suggest a diffuse ileus. There is also fairly marked inflammation and enhancement of the sigmoid colon near the colovesical fistula. No colonic mass is identified.  Vascular/Lymphatic: Stable significant age advanced atherosclerotic calcifications involving the aorta and iliac arteries. No aneurysm. The branch vessels are patent. The major venous structures are patent except for the intrahepatic portal veins.  Scattered retroperitoneal lymph nodes are again demonstrated.  Reproductive: The prostate gland and seminal vesicles are unremarkable. Perirectal collateral vessels are noted.  Other: Moderate abdominal ascites and diffuse mesenteric edema along with body wall edema.  Musculoskeletal: No significant bony findings.  IMPRESSION: 1. Large colovesical fistula and associated marked inflammation of the sigmoid colon and bladder dome. 2. Distended small bowel and colon with air-fluid levels suggesting a diffuse ileus. No findings for colonic obstruction. 3. Persistent extensive intrahepatic portal vein thrombosis. 4. Mesenteric edema, ascites and body wall edema. 5. Advanced atherosclerotic calcifications involving the aorta and coronary arteries for age.   Electronically Signed   By: Marijo Sanes M.D.   On: 12/23/2018 07:49  Assessment:   colovesicular fistula, unknown etiology.  LN negative for malignancy.  S/p cysto and biopsy, benign.  Flexible sigmoidoscopy performed and noted a large pedunculated polyp at 45 cm from anal verge, scope was unable to be advanced beyond this point secondary to likely stricture.  No obvious signs of fistula.  Verbal report from pathology for biopsy results of this pedunculated polyp is benign, and my  overall impression during the procedure was that this was not the cause of the fistula either.  Discussed case in detail with Dr. Erlene Quan urology and she recommended referral to a tertiary care center in the event that the patient may end up needing a bladder resection.  Due to the fact that we cannot completely rule out that scenario if we proceed with surgery here, I explained to the patient that the overall recommendation is transfer to her tertiary care center to either continue work-up or proceed with possible surgical intervention.    This will all be done hopefully after the noted ileus on the CT scan resolved after NG tube decompression.  If the ileus does not resolve, may need to be a more urgent transfer for more definitive care at a tertiary care center.  I believe that proceeding with surgery here just to address the ileus will potentially make the more definitive surgery patient may need for his fistula difficult and complicated.

## 2018-12-24 LAB — BASIC METABOLIC PANEL
Anion gap: 10 (ref 5–15)
BUN: 25 mg/dL — ABNORMAL HIGH (ref 6–20)
CHLORIDE: 108 mmol/L (ref 98–111)
CO2: 23 mmol/L (ref 22–32)
Calcium: 8.7 mg/dL — ABNORMAL LOW (ref 8.9–10.3)
Creatinine, Ser: 0.78 mg/dL (ref 0.61–1.24)
GFR calc Af Amer: >60 mL/min (ref >60)
GFR calc non Af Amer: >60 mL/min (ref >60)
Glucose, Bld: 115 mg/dL — ABNORMAL HIGH (ref 70–99)
POTASSIUM: 3.4 mmol/L — AB (ref 3.5–5.1)
Sodium: 141 mmol/L (ref 135–145)

## 2018-12-24 LAB — CBC WITH DIFFERENTIAL/PLATELET
Abs Immature Granulocytes: 0.06 10*3/uL (ref 0.00–0.07)
Basophils Absolute: 0.1 10*3/uL (ref 0.0–0.1)
Basophils Relative: 1 %
Eosinophils Absolute: 0 10*3/uL (ref 0.0–0.5)
Eosinophils Relative: 0 %
HCT: 34.9 % — ABNORMAL LOW (ref 39.0–52.0)
HEMOGLOBIN: 10.9 g/dL — AB (ref 13.0–17.0)
Immature Granulocytes: 1 %
Lymphocytes Relative: 19 %
Lymphs Abs: 1.5 10*3/uL (ref 0.7–4.0)
MCH: 28.9 pg (ref 26.0–34.0)
MCHC: 31.2 g/dL (ref 30.0–36.0)
MCV: 92.6 fL (ref 80.0–100.0)
MONO ABS: 0.8 10*3/uL (ref 0.1–1.0)
Monocytes Relative: 10 %
Neutro Abs: 5.6 10*3/uL (ref 1.7–7.7)
Neutrophils Relative %: 69 %
Platelets: 414 10*3/uL — ABNORMAL HIGH (ref 150–400)
RBC: 3.77 MIL/uL — ABNORMAL LOW (ref 4.22–5.81)
RDW: 15.9 % — ABNORMAL HIGH (ref 11.5–15.5)
WBC: 8 10*3/uL (ref 4.0–10.5)
nRBC: 0 % (ref 0.0–0.2)

## 2018-12-24 LAB — MAGNESIUM: Magnesium: 1.8 mg/dL (ref 1.7–2.4)

## 2018-12-24 LAB — PHOSPHORUS: Phosphorus: 4.2 mg/dL (ref 2.5–4.6)

## 2018-12-25 LAB — MAGNESIUM: Magnesium: 1.9 mg/dL (ref 1.7–2.4)

## 2018-12-25 LAB — BASIC METABOLIC PANEL
Anion gap: 11 (ref 5–15)
BUN: 27 mg/dL — ABNORMAL HIGH (ref 6–20)
CHLORIDE: 110 mmol/L (ref 98–111)
CO2: 22 mmol/L (ref 22–32)
CREATININE: 0.73 mg/dL (ref 0.61–1.24)
Calcium: 8.9 mg/dL (ref 8.9–10.3)
GFR calc Af Amer: >60 mL/min (ref >60)
GFR calc non Af Amer: >60 mL/min (ref >60)
Glucose, Bld: 80 mg/dL (ref 70–99)
Potassium: 2.7 mmol/L — CL (ref 3.5–5.1)
SODIUM: 143 mmol/L (ref 135–145)

## 2018-12-25 LAB — CBC WITH DIFFERENTIAL/PLATELET
Abs Immature Granulocytes: 0.03 10*3/uL (ref 0.00–0.07)
BASOS ABS: 0 10*3/uL (ref 0.0–0.1)
Basophils Relative: 0 %
Eosinophils Absolute: 0 10*3/uL (ref 0.0–0.5)
Eosinophils Relative: 0 %
HCT: 31.3 % — ABNORMAL LOW (ref 39.0–52.0)
Hemoglobin: 9.7 g/dL — ABNORMAL LOW (ref 13.0–17.0)
Immature Granulocytes: 0 %
Lymphocytes Relative: 10 %
Lymphs Abs: 0.7 10*3/uL (ref 0.7–4.0)
MCH: 29 pg (ref 26.0–34.0)
MCHC: 31 g/dL (ref 30.0–36.0)
MCV: 93.4 fL (ref 80.0–100.0)
Monocytes Absolute: 0.4 10*3/uL (ref 0.1–1.0)
Monocytes Relative: 6 %
NRBC: 0 % (ref 0.0–0.2)
Neutro Abs: 6.1 10*3/uL (ref 1.7–7.7)
Neutrophils Relative %: 84 %
Platelets: 275 10*3/uL (ref 150–400)
RBC: 3.35 MIL/uL — ABNORMAL LOW (ref 4.22–5.81)
RDW: 15.8 % — ABNORMAL HIGH (ref 11.5–15.5)
WBC: 7.3 10*3/uL (ref 4.0–10.5)

## 2018-12-25 LAB — PHOSPHORUS: Phosphorus: 3.6 mg/dL (ref 2.5–4.6)

## 2018-12-25 MED ORDER — KCL IN DEXTROSE-NACL 40-5-0.45 MEQ/L-%-% IV SOLN
INTRAVENOUS | Status: DC
  Filled 2018-12-25 (×2): qty 1000

## 2018-12-25 MED ORDER — POTASSIUM CHLORIDE CRYS ER 20 MEQ PO TBCR
40.0000 meq | EXTENDED_RELEASE_TABLET | ORAL | Status: AC
  Administered 2018-12-25 (×3): 40 meq via ORAL
  Filled 2018-12-25 (×2): qty 4
  Filled 2018-12-25: qty 2

## 2018-12-25 MED ORDER — ORAL CARE MOUTH RINSE
15.0000 mL | Freq: Two times a day (BID) | OROMUCOSAL | Status: DC
  Administered 2018-12-27: 15 mL via OROMUCOSAL

## 2018-12-25 NOTE — Progress Notes (Signed)
Subjective:  CC:  Tony Gillespie is a 48 y.o. male  Hospital stay day 11,  colovesicular fistula, unknown etiology, now with ileus  HPI: Pt feeling much better, with more BMs now, less distended.  ROS:  General: Denies weight loss, weight gain, fatigue, fevers, chills, and night sweats. Heart: Denies chest pain, palpitations, racing heart, irregular heartbeat, leg pain or swelling, and decreased activity tolerance. Respiratory: Denies breathing difficulty, shortness of breath, wheezing, cough, and sputum. GI: Denies change in appetite, heartburn, nausea, vomiting, constipation, diarrhea, and blood in stool.   Objective:      Temp:  [97.5 F (36.4 C)-97.7 F (36.5 C)] 97.5 F (36.4 C) (02/27 0501) Pulse Rate:  [86-96] 90 (02/27 0501) Resp:  [19-20] 19 (02/27 0501) BP: (127-132)/(85-88) 132/86 (02/27 0501) SpO2:  [98 %-99 %] 98 % (02/27 0501)     Height: 5\' 10"  (177.8 cm) Weight: 97.2 kg BMI (Calculated): 30.75   Intake/Output this shift:   Intake/Output Summary (Last 24 hours) at 12/25/2018 1109 Last data filed at 12/25/2018 0300 Gross per 24 hour  Intake 1285.96 ml  Output 300 ml  Net 985.96 ml     Constitutional :  alert, cooperative, appears stated age and no distress  Respiratory:  clear to auscultation bilaterally  Cardiovascular:  regular rate and rhythm  Gastrointestinal: soft, no guarding, continues to have decreasing distention with no tenderness now.   Skin: Cool and moist.   Psychiatric: Normal affect, non-agitated, not confused       LABS:  CMP Latest Ref Rng & Units 12/25/2018 12/24/2018 12/23/2018  Glucose 70 - 99 mg/dL 80 115(H) 101(H)  BUN 6 - 20 mg/dL 27(H) 25(H) 16  Creatinine 0.61 - 1.24 mg/dL 0.73 0.78 0.88  Sodium 135 - 145 mmol/L 143 141 138  Potassium 3.5 - 5.1 mmol/L 2.7(LL) 3.4(L) 3.6  Chloride 98 - 111 mmol/L 110 108 103  CO2 22 - 32 mmol/L 22 23 25   Calcium 8.9 - 10.3 mg/dL 8.9 8.7(L) 8.6(L)  Total Protein 6.5 - 8.1 g/dL - - -  Total  Bilirubin 0.3 - 1.2 mg/dL - - -  Alkaline Phos 38 - 126 U/L - - -  AST 15 - 41 U/L - - -  ALT 0 - 44 U/L - - -   CBC Latest Ref Rng & Units 12/25/2018 12/24/2018 12/23/2018  WBC 4.0 - 10.5 K/uL 7.3 8.0 18.6(H)  Hemoglobin 13.0 - 17.0 g/dL 9.7(L) 10.9(L) 11.5(L)  Hematocrit 39.0 - 52.0 % 31.3(L) 34.9(L) 37.2(L)  Platelets 150 - 400 K/uL 275 414(H) 454(H)    RADS: CLINICAL DATA:  Abdominal distension following sigmoidoscopy.  EXAM: CT ABDOMEN AND PELVIS WITH CONTRAST  TECHNIQUE: Multidetector CT imaging of the abdomen and pelvis was performed using the standard protocol following bolus administration of intravenous contrast.  CONTRAST:  48mL OMNIPAQUE IOHEXOL 350 MG/ML SOLN  COMPARISON:  CT scan 12/14/2018  FINDINGS: Lower chest: Streaky bibasilar atelectasis. No pleural effusions or worrisome pulmonary lesions. The heart is normal in size. No pericardial effusion. Three-vessel coronary artery calcifications are noted and are quite advanced for age.  Hepatobiliary: Extensive portal vein thrombosis is again demonstrated. The main portal vein is patent and the splenic vein is patent. Portal venous collaterals are noted and early cavernous transformation. No focal hepatic lesions.  Pancreas: No mass, inflammation or ductal dilatation. Small duodenal diverticulum noted near the pancreatic head.  Spleen: Stable mild splenomegaly and small defect in the upper anterior aspect of the spleen which could be splenic vein  thrombosis or small splenic infarct.  Adrenals/Urinary Tract: The adrenal glands and kidneys are unremarkable and stable.  There is a large amount of air in the bladder and the dome of the bladder is markedly thick walled and irregular. There is a colovesical fistula. Which is coming off the sigmoid colon. Measures approximately 4.5 x 3.7 cm.  Stomach/Bowel: The stomach is unremarkable. The duodenum appears normal. Dilated fluid-filled loops of small  bowel with air-fluid levels and dilated colon with fluid and air-fluid levels. Findings suggest a diffuse ileus. There is also fairly marked inflammation and enhancement of the sigmoid colon near the colovesical fistula. No colonic mass is identified.  Vascular/Lymphatic: Stable significant age advanced atherosclerotic calcifications involving the aorta and iliac arteries. No aneurysm. The branch vessels are patent. The major venous structures are patent except for the intrahepatic portal veins.  Scattered retroperitoneal lymph nodes are again demonstrated.  Reproductive: The prostate gland and seminal vesicles are unremarkable. Perirectal collateral vessels are noted.  Other: Moderate abdominal ascites and diffuse mesenteric edema along with body wall edema.  Musculoskeletal: No significant bony findings.  IMPRESSION: 1. Large colovesical fistula and associated marked inflammation of the sigmoid colon and bladder dome. 2. Distended small bowel and colon with air-fluid levels suggesting a diffuse ileus. No findings for colonic obstruction. 3. Persistent extensive intrahepatic portal vein thrombosis. 4. Mesenteric edema, ascites and body wall edema. 5. Advanced atherosclerotic calcifications involving the aorta and coronary arteries for age.   Electronically Signed   By: Marijo Sanes M.D.   On: 12/23/2018 07:49  Assessment:   In summary: 48 year old male no significant medical history presenting with pyuria.  Work-up noted a colovesicular fistula of unknown origin.  Treated with IV and oral antibiotics for his obvious UTI while being worked up for the possible cause of this fistula.  Initially underwent CT-guided biopsy of a abnormal lymph node noted on the CT scan.this came back as inflammatory and no signs of malignancy.  Next, a cystoscopy due to the CT read concerning for possible bladder mass performed.  Cystoscopy grossly only showed some inflamed tissue and food  debris but no obvious mass.  With biopsy results showing all benign tissue.  He has a history of diverticulosis noted on a previous colonoscopy back in 2014 with tubular adenoma completely resected.  He underwent a flex sig with a pedunculated tissue that came back as benign tissue.  The sigmoidoscopy could not be advanced beyond this pedunculated piece of tissue at 45 cm but there is no guarantee that was the point of the fistula formation.  Of note, patient did not have any significant bowel movements during the prep phase and was noted to be persistently distended and uncomfortable postprocedure as well, CT scan was ordered again by the covering hospitalist at night which showed possibility of ileus now.  Currently has NG tube in place and stable, but due to the possibility this still may be a malignant process causing the colovesicular fistula and now the possible obstruction, decision was made that patient be transferred to a tertiary care center.  The urologist that has been involved in the recommended to have bladder resection, if needed, to be completed outside of this hospital.  [facility] transfer center notified and willing to accept once bed is available.    Ileus now resolved, pull NG, will start diet.  Decreasing hgb noted, so will stop lovenox, some gastric irritation noted from NG output.  No transfusion yet but will continue to monitor.  Low K  replacement ordered as well

## 2018-12-25 NOTE — Progress Notes (Addendum)
Subjective:  CC:  Tony Gillespie is a 48 y.o. male  Hospital stay day 10,  colovesicular fistula, unknown etiology, now with ileus  HPI: Pt continues to pass small amounts of stool through rectum and endorses flatus, still distended, but more comfortable.   Continues to have feculent urine as well.   ROS:  General: Denies weight loss, weight gain, fatigue, fevers, chills, and night sweats. Heart: Denies chest pain, palpitations, racing heart, irregular heartbeat, leg pain or swelling, and decreased activity tolerance. Respiratory: Denies breathing difficulty, shortness of breath, wheezing, cough, and sputum. GI: Denies change in appetite, heartburn, nausea, vomiting, constipation, diarrhea, and blood in stool.   Objective:      Temp:  [97.5 F (36.4 C)-97.7 F (36.5 C)] 97.5 F (36.4 C) (02/27 0501) Pulse Rate:  [86-96] 90 (02/27 0501) Resp:  [19-20] 19 (02/27 0501) BP: (127-132)/(85-88) 132/86 (02/27 0501) SpO2:  [98 %-99 %] 98 % (02/27 0501)     Height: 5\' 10"  (177.8 cm) Weight: 97.2 kg BMI (Calculated): 30.75   Intake/Output this shift:   Intake/Output Summary (Last 24 hours) at 12/25/2018 1106 Last data filed at 12/25/2018 0300 Gross per 24 hour  Intake 1285.96 ml  Output 300 ml  Net 985.96 ml     Constitutional :  alert, cooperative, appears stated age and no distress  Respiratory:  clear to auscultation bilaterally  Cardiovascular:  regular rate and rhythm  Gastrointestinal: soft, no guarding, but decreased but still present distention with tenderness in all four quadrants.   Skin: Cool and moist.   Psychiatric: Normal affect, non-agitated, not confused       LABS:  CMP Latest Ref Rng & Units 12/24/2018 12/23/2018  Glucose 70 - 99 mg/dL 115(H) 101(H)  BUN 6 - 20 mg/dL 25(H) 16  Creatinine 0.61 - 1.24 mg/dL 0.78 0.88  Sodium 135 - 145 mmol/L 141 138  Potassium 3.5 - 5.1 mmol/L 3.4(L) 3.6  Chloride 98 - 111 mmol/L 108 103  CO2 22 - 32 mmol/L 23 25  Calcium  8.9 - 10.3 mg/dL 8.7(L) 8.6(L)  Total Protein 6.5 - 8.1 g/dL - -  Total Bilirubin 0.3 - 1.2 mg/dL - -  Alkaline Phos 38 - 126 U/L - -  AST 15 - 41 U/L - -  ALT 0 - 44 U/L - -   CBC Latest Ref Rng & Units 12/24/2018 12/23/2018  WBC 4.0 - 10.5 K/uL 8.0 18.6(H)  Hemoglobin 13.0 - 17.0 g/dL 10.9(L) 11.5(L)  Hematocrit 39.0 - 52.0 % 34.9(L) 37.2(L)  Platelets 150 - 400 K/uL 414(H) 454(H)    RADS: CLINICAL DATA:  Abdominal distension following sigmoidoscopy.  EXAM: CT ABDOMEN AND PELVIS WITH CONTRAST  TECHNIQUE: Multidetector CT imaging of the abdomen and pelvis was performed using the standard protocol following bolus administration of intravenous contrast.  CONTRAST:  24mL OMNIPAQUE IOHEXOL 350 MG/ML SOLN  COMPARISON:  CT scan 12/14/2018  FINDINGS: Lower chest: Streaky bibasilar atelectasis. No pleural effusions or worrisome pulmonary lesions. The heart is normal in size. No pericardial effusion. Three-vessel coronary artery calcifications are noted and are quite advanced for age.  Hepatobiliary: Extensive portal vein thrombosis is again demonstrated. The main portal vein is patent and the splenic vein is patent. Portal venous collaterals are noted and early cavernous transformation. No focal hepatic lesions.  Pancreas: No mass, inflammation or ductal dilatation. Small duodenal diverticulum noted near the pancreatic head.  Spleen: Stable mild splenomegaly and small defect in the upper anterior aspect of the spleen which could be  splenic vein thrombosis or small splenic infarct.  Adrenals/Urinary Tract: The adrenal glands and kidneys are unremarkable and stable.  There is a large amount of air in the bladder and the dome of the bladder is markedly thick walled and irregular. There is a colovesical fistula. Which is coming off the sigmoid colon. Measures approximately 4.5 x 3.7 cm.  Stomach/Bowel: The stomach is unremarkable. The duodenum appears normal.  Dilated fluid-filled loops of small bowel with air-fluid levels and dilated colon with fluid and air-fluid levels. Findings suggest a diffuse ileus. There is also fairly marked inflammation and enhancement of the sigmoid colon near the colovesical fistula. No colonic mass is identified.  Vascular/Lymphatic: Stable significant age advanced atherosclerotic calcifications involving the aorta and iliac arteries. No aneurysm. The branch vessels are patent. The major venous structures are patent except for the intrahepatic portal veins.  Scattered retroperitoneal lymph nodes are again demonstrated.  Reproductive: The prostate gland and seminal vesicles are unremarkable. Perirectal collateral vessels are noted.  Other: Moderate abdominal ascites and diffuse mesenteric edema along with body wall edema.  Musculoskeletal: No significant bony findings.  IMPRESSION: 1. Large colovesical fistula and associated marked inflammation of the sigmoid colon and bladder dome. 2. Distended small bowel and colon with air-fluid levels suggesting a diffuse ileus. No findings for colonic obstruction. 3. Persistent extensive intrahepatic portal vein thrombosis. 4. Mesenteric edema, ascites and body wall edema. 5. Advanced atherosclerotic calcifications involving the aorta and coronary arteries for age.   Electronically Signed   By: Marijo Sanes M.D.   On: 12/23/2018 07:49  Assessment:   In summary: 48 year old male no significant medical history presenting with pyuria.  Work-up noted a colovesicular fistula of unknown origin.  Treated with IV and oral antibiotics for his obvious UTI while being worked up for the possible cause of this fistula.  Initially underwent CT-guided biopsy of a abnormal lymph node noted on the CT scan.this came back as inflammatory and no signs of malignancy.  Next, a cystoscopy due to the CT read concerning for possible bladder mass performed.  Cystoscopy grossly only  showed some inflamed tissue and food debris but no obvious mass.  With biopsy results showing all benign tissue.  He has a history of diverticulosis noted on a previous colonoscopy back in 2014 with tubular adenoma completely resected.  He underwent a flex sig with a pedunculated tissue that came back as benign tissue.  The sigmoidoscopy could not be advanced beyond this pedunculated piece of tissue at 45 cm but there is no guarantee that was the point of the fistula formation.  Of note, patient did not have any significant bowel movements during the prep phase and was noted to be persistently distended and uncomfortable postprocedure as well, CT scan was ordered again by the covering hospitalist at night which showed possibility of ileus now.  Currently has NG tube in place and stable, but due to the possibility this still may be a malignant process causing the colovesicular fistula and now the possible obstruction, decision was made that patient be transferred to a tertiary care center.  The urologist that has been involved in the recommended to have bladder resection, if needed, to be completed outside of this hospital.  [facility] transfer center notified and willing to accept once bed is available.    Still keep NPO for another day, but likely going to improve in next day or two

## 2018-12-25 NOTE — Progress Notes (Signed)
   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 Pt was pleasant to talk with. Ch provided affirmation and encouragement.

## 2018-12-26 ENCOUNTER — Inpatient Hospital Stay: Payer: Self-pay

## 2018-12-26 DIAGNOSIS — Z881 Allergy status to other antibiotic agents status: Secondary | ICD-10-CM

## 2018-12-26 DIAGNOSIS — R509 Fever, unspecified: Secondary | ICD-10-CM

## 2018-12-26 DIAGNOSIS — R000 Tachycardia, unspecified: Secondary | ICD-10-CM

## 2018-12-26 LAB — COMPREHENSIVE METABOLIC PANEL
ALT: 23 U/L (ref 0–44)
AST: 26 U/L (ref 15–41)
Albumin: 2.2 g/dL — ABNORMAL LOW (ref 3.5–5.0)
Alkaline Phosphatase: 204 U/L — ABNORMAL HIGH (ref 38–126)
Anion gap: 9 (ref 5–15)
BILIRUBIN TOTAL: 0.6 mg/dL (ref 0.3–1.2)
BUN: 22 mg/dL — ABNORMAL HIGH (ref 6–20)
CO2: 23 mmol/L (ref 22–32)
CREATININE: 0.71 mg/dL (ref 0.61–1.24)
Calcium: 8.6 mg/dL — ABNORMAL LOW (ref 8.9–10.3)
Chloride: 107 mmol/L (ref 98–111)
GFR calc Af Amer: >60 mL/min (ref >60)
GFR calc non Af Amer: >60 mL/min (ref >60)
Glucose, Bld: 139 mg/dL — ABNORMAL HIGH (ref 70–99)
Potassium: 3.1 mmol/L — ABNORMAL LOW (ref 3.5–5.1)
Sodium: 139 mmol/L (ref 135–145)
Total Protein: 6 g/dL — ABNORMAL LOW (ref 6.5–8.1)

## 2018-12-26 LAB — CBC WITH DIFFERENTIAL/PLATELET
ABS IMMATURE GRANULOCYTES: 0.09 10*3/uL — AB (ref 0.00–0.07)
Abs Immature Granulocytes: 0.04 10*3/uL (ref 0.00–0.07)
Basophils Absolute: 0 10*3/uL (ref 0.0–0.1)
Basophils Absolute: 0 10*3/uL (ref 0.0–0.1)
Basophils Relative: 0 %
Basophils Relative: 0 %
Eosinophils Absolute: 0 10*3/uL (ref 0.0–0.5)
Eosinophils Absolute: 0 10*3/uL (ref 0.0–0.5)
Eosinophils Relative: 0 %
Eosinophils Relative: 0 %
HCT: 29.5 % — ABNORMAL LOW (ref 39.0–52.0)
HEMATOCRIT: 30.9 % — AB (ref 39.0–52.0)
HEMOGLOBIN: 9.6 g/dL — AB (ref 13.0–17.0)
Hemoglobin: 9.2 g/dL — ABNORMAL LOW (ref 13.0–17.0)
Immature Granulocytes: 0 %
Immature Granulocytes: 1 %
LYMPHS PCT: 10 %
Lymphocytes Relative: 4 %
Lymphs Abs: 0.9 10*3/uL (ref 0.7–4.0)
Lymphs Abs: 0.3 10*3/uL — ABNORMAL LOW (ref 0.7–4.0)
MCH: 29.1 pg (ref 26.0–34.0)
MCH: 28.8 pg (ref 26.0–34.0)
MCHC: 31.1 g/dL (ref 30.0–36.0)
MCHC: 31.2 g/dL (ref 30.0–36.0)
MCV: 93.6 fL (ref 80.0–100.0)
MCV: 92.2 fL (ref 80.0–100.0)
MONOS PCT: 5 %
MONOS PCT: 1 %
Monocytes Absolute: 0.5 10*3/uL (ref 0.1–1.0)
Monocytes Absolute: 0.1 10*3/uL (ref 0.1–1.0)
Neutro Abs: 8 10*3/uL — ABNORMAL HIGH (ref 1.7–7.7)
Neutro Abs: 7.2 10*3/uL (ref 1.7–7.7)
Neutrophils Relative %: 85 %
Neutrophils Relative %: 94 %
Platelets: 277 10*3/uL (ref 150–400)
Platelets: 242 10*3/uL (ref 150–400)
RBC: 3.3 MIL/uL — ABNORMAL LOW (ref 4.22–5.81)
RBC: 3.2 MIL/uL — ABNORMAL LOW (ref 4.22–5.81)
RDW: 15.7 % — ABNORMAL HIGH (ref 11.5–15.5)
RDW: 15.4 % (ref 11.5–15.5)
Smear Review: NORMAL
WBC: 9.5 10*3/uL (ref 4.0–10.5)
WBC: 7.4 10*3/uL (ref 4.0–10.5)
nRBC: 0 % (ref 0.0–0.2)
nRBC: 0 % (ref 0.0–0.2)

## 2018-12-26 LAB — BASIC METABOLIC PANEL
Anion gap: 5 (ref 5–15)
BUN: 22 mg/dL — ABNORMAL HIGH (ref 6–20)
CO2: 25 mmol/L (ref 22–32)
Calcium: 8.7 mg/dL — ABNORMAL LOW (ref 8.9–10.3)
Chloride: 110 mmol/L (ref 98–111)
Creatinine, Ser: 0.74 mg/dL (ref 0.61–1.24)
GFR calc Af Amer: >60 mL/min (ref >60)
GFR calc non Af Amer: >60 mL/min (ref >60)
Glucose, Bld: 131 mg/dL — ABNORMAL HIGH (ref 70–99)
Potassium: 3.1 mmol/L — ABNORMAL LOW (ref 3.5–5.1)
Sodium: 140 mmol/L (ref 135–145)

## 2018-12-26 LAB — MAGNESIUM: Magnesium: 2 mg/dL (ref 1.7–2.4)

## 2018-12-26 LAB — PHOSPHORUS: PHOSPHORUS: 2.2 mg/dL — AB (ref 2.5–4.6)

## 2018-12-26 MED ORDER — TRAMADOL HCL 50 MG PO TABS
50.0000 mg | ORAL_TABLET | Freq: Four times a day (QID) | ORAL | Status: DC | PRN
  Administered 2018-12-26 – 2018-12-27 (×5): 50 mg via ORAL
  Filled 2018-12-26 (×5): qty 1

## 2018-12-26 MED ORDER — CELECOXIB 200 MG PO CAPS
200.0000 mg | ORAL_CAPSULE | Freq: Every day | ORAL | Status: DC | PRN
  Administered 2018-12-26: 200 mg via ORAL
  Filled 2018-12-26 (×2): qty 1

## 2018-12-26 MED ORDER — OCUVITE-LUTEIN PO CAPS
1.0000 | ORAL_CAPSULE | Freq: Every day | ORAL | Status: DC
  Administered 2018-12-26 – 2018-12-27 (×2): 1 via ORAL
  Filled 2018-12-26 (×2): qty 1

## 2018-12-26 MED ORDER — AMOXICILLIN-POT CLAVULANATE 875-125 MG PO TABS
1.0000 | ORAL_TABLET | Freq: Two times a day (BID) | ORAL | Status: DC
  Administered 2018-12-26 – 2018-12-27 (×3): 1 via ORAL
  Filled 2018-12-26 (×4): qty 1

## 2018-12-26 MED ORDER — POTASSIUM CHLORIDE CRYS ER 10 MEQ PO TBCR
10.0000 meq | EXTENDED_RELEASE_TABLET | Freq: Two times a day (BID) | ORAL | Status: DC
  Administered 2018-12-26 – 2018-12-27 (×3): 10 meq via ORAL
  Filled 2018-12-26 (×4): qty 1

## 2018-12-26 NOTE — Discharge Summary (Signed)
Physician Discharge Summary  Patient ID: Tony Gillespie MRN: [id number] DOB/AGE: Aug 23, 1971 48 y.o.  Admit date: 12/14/2018 Discharge date: 12/29/2018  Admission Diagnoses: Colovesicular fistula and anemia  Discharge Diagnoses:  Same as above plus malnourished  Discharged Condition: good  Hospital Course:  48 year old male no significant medical history presenting with pyuria.  Work-up noted a colovesicular fistula of unknown origin.  Treated with IV and oral antibiotics for his obvious UTI while being worked up for the possible cause of this fistula.  Initially underwent CT-guided biopsy of a abnormal lymph node noted on the CT scan.this came back as inflammatory and no signs of malignancy.  Next, a cystoscopy due to the CT read concerning for possible bladder mass performed.  Cystoscopy grossly only showed some inflamed tissue and food debris but no obvious mass.  With biopsy results showing all benign tissue.  He has a history of diverticulosis noted on a previous colonoscopy back in 2014 with tubular adenoma completely resected.  He underwent a flex sig with a pedunculated tissue that came back as benign tissue.  The sigmoidoscopy could not be advanced beyond this pedunculated piece of tissue at 45 cm but there is no guarantee that was the point of the fistula formation.  Of note, patient did not have any significant bowel movements during the prep phase and was noted to be persistently distended and uncomfortable postprocedure as well, CT scan was ordered again by the covering hospitalist at night which showed possibility of ileus underwent NG tube decompression and the ileus resolved.  Patient was able to tolerate a diet afterwards.  Day prior to discharge, patient experienced an episode of chills.  Afebrile, labs within normal limits, but heart rate elevated into the 150s, EKG showed sinus tachycardia.  This episode resolved on its own without any intervention.  ID consulted and believed the  chills were recurrent episodes of bacteremia secondary to the fistula.  Recommended  Augmentin since urine cultures grew pansensitive E. coli.  By time of discharge patient's pain was controlled with oral pain meds.    The urologist that has been involved in the care recommended to have continued diagnostic work-up and eventual bladder resection, if needed, to be completed outside of this hospital.  [facility] colorectal service notified and appointment made for follow-up as a close outpatient basis.  Patient also received a unit of transfusion at the time of admission due to a low hemoglobin.  Hemoglobin still remains slightly low but stable at time of discharge.  Hematology recommended transfusion as needed.  Continue close follow-up as well.  His albumin was also noted to be low at 2.2 nutritional supplementations were ordered and asked to continue after discharge.  Consults: ID, hematology/oncology and urology  Discharge Exam: Blood pressure 100/65, pulse 84, temperature 98.4 F (36.9 C), temperature source Oral, resp. rate 16, height 5\' 10"  (1.778 m), weight 97.2 kg, SpO2 97 %. General appearance: alert, cooperative and no distress GI: soft, non-tender; bowel sounds normal; no masses,  no organomegaly  Disposition:    Discharge Instructions    Care order/instruction   Complete by:  As directed    Transfuse Parameters   Complete patient signature process for consent form   Complete by:  As directed    Practitioner attestation of consent   Complete by:  As directed    I, the ordering practitioner, attest that I have discussed with the patient the benefits, risks, side effects, alternatives, likelihood of achieving goals and potential problems during recovery for  the procedure listed.   Procedure:  Blood Product(s)   Type and screen   Complete by:  As directed    [place]      Allergies as of 12/27/2018      Reactions   Erythromycin Nausea And Vomiting       Medication List    STOP taking these medications   permethrin 5 % cream Commonly known as:  ELIMITE     TAKE these medications   amoxicillin-clavulanate 875-125 MG tablet Commonly known as:  AUGMENTIN Take 1 tablet by mouth every 12 (twelve) hours for 7 days.   traMADol 50 MG tablet Commonly known as:  ULTRAM Take 1 tablet (50 mg total) by mouth every 6 (six) hours as needed for severe pain.         Total time spent arranging discharge was >46min. Signed: Benjamine Sprague 12/29/2018, 6:15 PM

## 2018-12-26 NOTE — Consult Note (Signed)
NAME: Tony Gillespie  DOB: 1971/03/31  MRN: [id number]  Date/Time: 12/26/2018 12:05 PM  REQUESTING PROVIDER: Lysle Pearl Subjective:  REASON FOR CONSULT: fever and chills ? Tony Gillespie is a 48 y.o.male  With no significant history presented to ED on 2/16 with foul smelling urine and seeing stool in his urine As per patient he has been volunteering abroad for the past 2 years- in 2018 he went to [place] and stayed there for 18 months. He then went to [place] Nov 2019 for 87 days and also to [place] for a week and returned to the [place] on feb 5th. Last of week Jan he had noted s foul urine. He also lost 20 pounds which he attributed to poor eating in while in [place]. Marland Kitchen He was also having chills, sweats . He denied any cough, sob, chest pain. Was having swelling of his legs In the ED he had CT abdomen which showed collection of air, debris, and soft tissue thickening between the sigmoid colon and the bladder dome and portal vein branches thrombosis- He was diagnosed with colovesical fistual and as urine culture had e.coli was started zosyn and then keflex On 2/17 he underwent retroperitoneal needle biopsy on thr left side and pathology was REACTIVE APPEARING LYMPHOID TISSUE WITH SINUS HISTIOCYTOSIS AND PLASMACYTOSIS.  - NO LYMPHOPROLIFERATIVE PROCESS OR METASTATIC MALIGNANCY IDENTIFIED He underwent cystoscopy on 12/17/18 and it showed a sizeable colo vesicular fistula  and anterior bladder wall appeared to be significantly abnormal with a raised like area, slightly hyperemic and bullous in appearance.  There are certain areas which appear to be more suspicious for mass/tumor which had almost a glandular appearance. 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 - NEGATIVE FOR DYSPLASIA AND MALIGNANCY IN THIS SPECIMEN.  He had flex sigmoidoscopy by Dr. Lysle Pearl on 12/22/18  and pedunculated tissue that came back as benign tissue.  The sigmoidoscopy  could not be advanced beyond this pedunculated piece of tissue at 45 cm The plan was to discharge him today when he had a temp of 100.8, chills, tachycardia  He is now sitting comfortably in bed   History reviewed. No pertinent past medical history.  Past Surgical History:  Procedure Laterality Date  . CYSTOGRAM N/A 12/17/2018   Procedure: CYSTOGRAM;  Surgeon: Hollice Espy, MD;  Location: [facility] ORS;  Service: Urology;  Laterality: N/A;  . CYSTOSCOPY WITH BIOPSY N/A 12/17/2018   Procedure: CYSTOSCOPY WITH BLADDER  BIOPSY;  Surgeon: Hollice Espy, MD;  Location: [facility] ORS;  Service: Urology;  Laterality: N/A;  . FLEXIBLE SIGMOIDOSCOPY N/A 12/22/2018   Procedure: FLEXIBLE SIGMOIDOSCOPY;  Surgeon: Benjamine Sprague, DO;  Location: [facility] ENDOSCOPY;  Service: General;  Laterality: N/A;  . SPINAL FUSION    . THORACIC FUSION    . TONSILLECTOMY       SH Single Lives with his mother Former smoker Not sexually active in a decade   Family History  Problem Relation Age of Onset  . Colon polyps Father   . Heart disease Father   . Kidney disease Father   . Diabetes Maternal Aunt   . Prostate cancer Paternal Uncle    Allergies  Allergen Reactions  . Erythromycin Nausea And Vomiting   ? Current Facility-Administered Medications  Medication Dose Route Frequency Provider Last Rate Last Dose  . acetaminophen (TYLENOL) tablet 650 mg  650 mg Oral Q6H Sakai, Isami, DO   650 mg at 12/26/18 1017  . ALPRAZolam (XANAX) tablet 0.25 mg  0.25 mg Oral Q6H PRN Harrie Foreman, MD      . docusate sodium (COLACE) capsule 100 mg  100 mg Oral BID PRN Vaughan Basta, MD   100 mg at 12/20/18 1535  . feeding supplement (ENSURE ENLIVE) (ENSURE ENLIVE) liquid 237 mL  237 mL Oral TID BM Gladstone Lighter, MD   237 mL at 12/26/18 0000  . HYDROmorphone (DILAUDID) injection 1 mg  1 mg Intravenous Q6H PRN Harrie Foreman, MD   1 mg at 12/26/18 9528  . ketorolac (TORADOL) 30 MG/ML injection 30 mg  30 mg  Intravenous Q8H PRN Sakai, Isami, DO   30 mg at 12/26/18 1158  . MEDLINE mouth rinse  15 mL Mouth Rinse BID Mayo, Pete Pelt, MD      . ondansetron [facility]) injection 4 mg  4 mg Intravenous Q6H PRN Sakai, Isami, DO      . phenol (CHLORASEPTIC) mouth spray 1 spray  1 spray Mouth/Throat PRN Sakai, Isami, DO      . phenylephrine-shark liver oil-mineral oil-petrolatum (PREPARATION H) rectal ointment   Rectal BID PRN Lance Coon, MD      . simethicone [facility]) chewable tablet 80 mg  80 mg Oral QID PRN Sakai, Isami, DO      . sodium chloride flush (NS) 0.9 % injection 10 mL  10 mL Intracatheter PRN Charlaine Dalton R, MD      . sodium chloride flush (NS) 0.9 % injection 3 mL  3 mL Intracatheter PRN Cammie Sickle, MD      . traZODone (DESYREL) tablet 50 mg  50 mg Oral QHS PRN Lance Coon, MD   50 mg at 12/20/18 2219     Abtx:  Anti-infectives (From admission, onward)   Start     Dose/Rate Route Frequency Ordered Stop   12/17/18 1511  piperacillin-tazobactam (ZOSYN) 3.375 GM/50ML IVPB    Note to Pharmacy:  [place]: cabinet override      12/17/18 1511 12/17/18 1530   12/16/18 1000  cephALEXin (KEFLEX) capsule 500 mg  Status:  Discontinued     500 mg Oral Every 8 hours 12/16/18 0852 12/22/18 1145   12/15/18 0415  piperacillin-tazobactam (ZOSYN) IVPB 3.375 g  Status:  Discontinued     3.375 g 12.5 mL/hr over 240 Minutes Intravenous Every 8 hours 12/15/18 0413 12/16/18 0851   12/14/18 1045  cefTRIAXone (ROCEPHIN) 1 g in sodium chloride 0.9 % 100 mL IVPB     1 g 200 mL/hr over 30 Minutes Intravenous  Once 12/14/18 1033 12/14/18 1127      REVIEW OF SYSTEMS:  Const:fever,  chills, weight loss Eyes: negative diplopia or visual changes, negative eye pain ENT: negative coryza, negative sore throat Resp: negative cough, hemoptysis, has dyspnea Cards: negative for chest pain, has palpitations, lower extremity edema GU: as above GI: Negative for abdominal pain, +diarrhea,  bleeding, constipation Skin: negative for rash and pruritus Heme: negative for easy bruising and gum/nose bleeding MS: fatigue Neurolo:negative for headaches, dizziness, vertigo, memory problems  Psych: negative for feelings of anxiety, depression  Endocrine: no polyuria or polydipsia Allergy/Immunology- negative for any medication or food allergies ? Objective:  VITALS:  BP 105/65 (BP Location: Left Arm)   Pulse 97   Temp (!) 100.8 F (38.2 C) (Oral)   Resp 20   Ht 5\' 10"  (1.778 m)   Wt 97.2 kg   SpO2 96%   BMI 30.75 kg/m  PHYSICAL EXAM:  General: Alert, cooperative, no distress, appears stated age. Thin  Head: Normocephalic, without obvious abnormality, atraumatic. Eyes: Conjunctivae clear, anicteric sclerae. Pupils are equal ENT Nares normal. No drainage or sinus tenderness. Lips, mucosa, and tongue normal. No Thrush Neck: Supple, symmetrical, no adenopathy, thyroid: non tender no carotid bruit and no JVD. Back: No CVA tenderness. Lungs: Clear to auscultation bilaterally. No Wheezing or Rhonchi. No rales. Heart: Regular rate and rhythm, no murmur, rub or gallop. Abdomen: Soft,  distended. Bowel sounds normal. No masses Extremities: edema ankles Skin: No rashes or lesions. Or bruising Lymph: Cervical, supraclavicular normal. Neurologic: Grossly non-focal Pertinent Labs Lab Results CBC    Component Value Date/Time   WBC 7.4 12/26/2018 0944   RBC 3.20 (L) 12/26/2018 0944   HGB 9.2 (L) 12/26/2018 0944   HCT 29.5 (L) 12/26/2018 0944   PLT 242 12/26/2018 0944   MCV 92.2 12/26/2018 0944   MCH 28.8 12/26/2018 0944   MCHC 31.2 12/26/2018 0944   RDW 15.4 12/26/2018 0944   LYMPHSABS 0.3 (L) 12/26/2018 0944   MONOABS 0.1 12/26/2018 0944   EOSABS 0.0 12/26/2018 0944   BASOSABS 0.0 12/26/2018 0944    CMP Latest Ref Rng & Units 12/26/2018 12/26/2018 12/25/2018  Glucose 70 - 99 mg/dL 139(H) 131(H) 80  BUN 6 - 20 mg/dL 22(H) 22(H) 27(H)  Creatinine 0.61 - 1.24 mg/dL 0.71  0.74 0.73  Sodium 135 - 145 mmol/L 139 140 143  Potassium 3.5 - 5.1 mmol/L 3.1(L) 3.1(L) 2.7(LL)  Chloride 98 - 111 mmol/L 107 110 110  CO2 22 - 32 mmol/L 23 25 22   Calcium 8.9 - 10.3 mg/dL 8.6(L) 8.7(L) 8.9  Total Protein 6.5 - 8.1 g/dL 6.0(L) - -  Total Bilirubin 0.3 - 1.2 mg/dL 0.6 - -  Alkaline Phos 38 - 126 U/L 204(H) - -  AST 15 - 41 U/L 26 - -  ALT 0 - 44 U/L 23 - -      Microbiology:BC 2/16 NG 2/16 urine e.coli IMAGING RESULTS:as above  I have personally reviewed the films ? Impression/Recommendation ? ?48 yr male with no PMH  New colovesical fistula- malignancy VS diverticulitis VS infection like schistosoma especially with portal vein thrombosis , travel to [place], [place]  Gram neg infection secondary to colovesical fistula and stool in the urine- treated e.coli UTi but I am afraid because of the persistent fistula he is at risk for ongoing infection both in the kidney and systemically-  Plan is to transfer him to [facility] or [facility] but no bed- hence he is being discharged and has a OP app[facility] on Monday. He will be discharged on Augmentin  Will send HIV, schistosoma antibody in the blood,  Will need O/P stool and urine Pathology did not reveal schistosomiasis  This is unlikely to be TB Will get quant gold  Discussed the management with Dr.Sakai ? ___________________________________________________ Discussed with patient in detail

## 2018-12-26 NOTE — Discharge Instructions (Signed)
Followup with [facility] colorectal surgery as previously arranged.  Continue antibiotics until further recommendations from their service.    Followup with Benjamine Sprague, DO General Surgery with any additional questions in the meantime at [phone]

## 2018-12-27 LAB — HIV ANTIBODY (ROUTINE TESTING W REFLEX): HIV Screen 4th Generation wRfx: NONREACTIVE

## 2018-12-27 LAB — BASIC METABOLIC PANEL
Anion gap: 8 (ref 5–15)
BUN: 21 mg/dL — ABNORMAL HIGH (ref 6–20)
CALCIUM: 8.2 mg/dL — AB (ref 8.9–10.3)
CO2: 23 mmol/L (ref 22–32)
CREATININE: 0.69 mg/dL (ref 0.61–1.24)
Chloride: 108 mmol/L (ref 98–111)
GFR calc Af Amer: >60 mL/min (ref >60)
GFR calc non Af Amer: >60 mL/min (ref >60)
Glucose, Bld: 125 mg/dL — ABNORMAL HIGH (ref 70–99)
Potassium: 3.2 mmol/L — ABNORMAL LOW (ref 3.5–5.1)
Sodium: 139 mmol/L (ref 135–145)

## 2018-12-27 LAB — CBC WITH DIFFERENTIAL/PLATELET
Abs Immature Granulocytes: 0.14 10*3/uL — ABNORMAL HIGH (ref 0.00–0.07)
BASOS ABS: 0 10*3/uL (ref 0.0–0.1)
Basophils Relative: 0 %
Eosinophils Absolute: 0 10*3/uL (ref 0.0–0.5)
Eosinophils Relative: 0 %
HCT: 27.8 % — ABNORMAL LOW (ref 39.0–52.0)
Hemoglobin: 8.6 g/dL — ABNORMAL LOW (ref 13.0–17.0)
Immature Granulocytes: 1 %
Lymphocytes Relative: 4 %
Lymphs Abs: 0.5 10*3/uL — ABNORMAL LOW (ref 0.7–4.0)
MCH: 28.6 pg (ref 26.0–34.0)
MCHC: 30.9 g/dL (ref 30.0–36.0)
MCV: 92.4 fL (ref 80.0–100.0)
Monocytes Absolute: 0.6 10*3/uL (ref 0.1–1.0)
Monocytes Relative: 4 %
NRBC: 0 % (ref 0.0–0.2)
Neutro Abs: 12.9 10*3/uL — ABNORMAL HIGH (ref 1.7–7.7)
Neutrophils Relative %: 91 %
PLATELETS: 203 10*3/uL (ref 150–400)
RBC: 3.01 MIL/uL — ABNORMAL LOW (ref 4.22–5.81)
RDW: 15.8 % — ABNORMAL HIGH (ref 11.5–15.5)
WBC: 14.3 10*3/uL — AB (ref 4.0–10.5)

## 2018-12-27 LAB — MAGNESIUM: Magnesium: 1.6 mg/dL — ABNORMAL LOW (ref 1.7–2.4)

## 2018-12-27 LAB — HEPATITIS PANEL, ACUTE
HCV Ab: 0.5 s/co ratio (ref 0.0–0.9)
Hep A IgM: NEGATIVE
Hep B C IgM: NEGATIVE
Hepatitis B Surface Ag: NEGATIVE

## 2018-12-27 LAB — PHOSPHORUS: Phosphorus: 1.9 mg/dL — ABNORMAL LOW (ref 2.5–4.6)

## 2018-12-27 MED ORDER — AMOXICILLIN-POT CLAVULANATE 875-125 MG PO TABS: 1.0000 | ORAL_TABLET | Freq: Two times a day (BID) | ORAL | 0 refills | Status: AC

## 2018-12-27 MED ORDER — TRAMADOL HCL 50 MG PO TABS: 50.0000 mg | ORAL_TABLET | Freq: Four times a day (QID) | ORAL | 0 refills | Status: AC | PRN

## 2018-12-27 NOTE — Discharge Summary (Signed)
Patient ID: Tony Gillespie MRN: [id number] DOB/AGE: 01/10/1971 48 y.o.  Admit date: 12/14/2018 Discharge date: 12/27/2018   Discharge Diagnoses:  Active Problems:   Bladder mass   Hematuria   Symptomatic anemia   Colo-vesical fistula   Protein-calorie malnutrition, severe   Procedures sigmoidoscopy and cystoscopy  Hospital Course:  48 year old male no significant medical history presenting with pyuria. Work-up noted a colovesicular fistula of unknown origin. Treated with IV and oral antibiotics for his obvious UTI while being worked up for the possible cause of this fistula. Initially underwent CT-guided biopsy of a abnormal lymph node noted on the CT scan.this came back as inflammatory and no signs of malignancy. Next, a cystoscopy due to the CT read concerning for possible bladder mass performed. Cystoscopy grossly only showed some inflamed tissue and food debris but no obvious mass. With biopsy results showing all benign tissue. He has a history of diverticulosis noted on a previous colonoscopy back in 2014 with tubular adenoma completely resected. He underwent a flex sig with a pedunculated tissue that came back as benign tissue. The sigmoidoscopy could not be advanced beyond this pedunculated piece of tissue at 45 cm but there is no guarantee that was the point of the fistula formation. Of note, patient did not have any significant bowel movements during the prep phase and was noted to be persistently distended and uncomfortable postprocedure as well, CT scan was ordered again by the covering hospitalist at night which showed possibility of ileus underwent NG tube decompression and the ileus resolved.  Patient was able to tolerate a diet afterwards.  Day prior to discharge, patient experienced an episode of chills.  Afebrile, labs within normal limits, but heart rate elevated into the 150s, EKG showed sinus tachycardia.  This episode resolved on its own without any  intervention.  ID consulted and believed the chills were recurrent episodes of bacteremia secondary to the fistula.  Recommended  Augmentin since urine cultures grew pansensitive E. coli.  By time of discharge patient's pain was controlled with oral pain meds.    The urologist that has been involved in the care recommended to have continued diagnostic work-up and eventual bladder resection, if needed, to be completed outside of this hospital.  [facility] colorectal service notified and appointment made for follow-up as a close outpatient basis.  Patient also received a unit of transfusion at the time of admission due to a low hemoglobin.  Hemoglobin still remains slightly low but stable at time of discharge.  Hematology recommended transfusion as needed.  Continue close follow-up as well.  His albumin was also noted to be low at 2.2 nutritional supplementations were ordered and asked to continue after discharge.  Consults: ID, hematology/oncology and urology  Discharge Exam: Blood pressure 105/65, pulse 97, temperature (!) 100.8 F (38.2 C), temperature source Oral, resp. rate 20, height 5\' 10"  (1.778 m), weight 97.2 kg, SpO2 96 %. In good spirits, eating pizza. General appearance: alert, cooperative and no distress GI: soft, non-tender; bowel sounds normal; no masses,  no organomegaly, no peritonitis.     Consults: GU  Disposition:  Home close f/u CR surgery [facility]  Discharge Instructions    Care order/instruction   Complete by:  As directed    Transfuse Parameters   Complete patient signature process for consent form   Complete by:  As directed    Practitioner attestation of consent   Complete by:  As directed    I, the ordering practitioner, attest that I have discussed with  the patient the benefits, risks, side effects, alternatives, likelihood of achieving goals and potential problems during recovery for the procedure listed.   Procedure:  Blood Product(s)   Type and screen    Complete by:  As directed    [place]      Allergies as of 12/27/2018      Reactions   Erythromycin Nausea And Vomiting      Medication List    STOP taking these medications   permethrin 5 % cream Commonly known as:  ELIMITE     TAKE these medications   amoxicillin-clavulanate 875-125 MG tablet Commonly known as:  AUGMENTIN Take 1 tablet by mouth every 12 (twelve) hours for 7 days.   traMADol 50 MG tablet Commonly known as:  ULTRAM Take 1 tablet (50 mg total) by mouth every 6 (six) hours as needed for severe pain.         Caroleen Hamman, MD FACS

## 2018-12-29 LAB — MISC LABCORP TEST (SEND OUT)

## 2018-12-30 LAB — QUANTIFERON-TB GOLD PLUS: QuantiFERON-TB Gold Plus: NEGATIVE

## 2018-12-30 LAB — QUANTIFERON-TB GOLD PLUS (RQFGPL)
QuantiFERON Mitogen Value: 1.54 IU/mL
QuantiFERON Nil Value: 0.05 IU/mL
QuantiFERON TB1 Ag Value: 0.05 IU/mL
QuantiFERON TB2 Ag Value: 0.05 IU/mL

## 2018-12-31 LAB — CULTURE, BLOOD (ROUTINE X 2)
Culture: NO GROWTH
Culture: NO GROWTH

## 2020-10-28 IMAGING — DX DG ABDOMEN 1V
1 series · 1 of 1 positions shown · non-contrast
Comparison: None.

CLINICAL DATA: Nasogastric tube placement.

EXAM:
ABDOMEN - 1 VIEW

[abdomen erect]
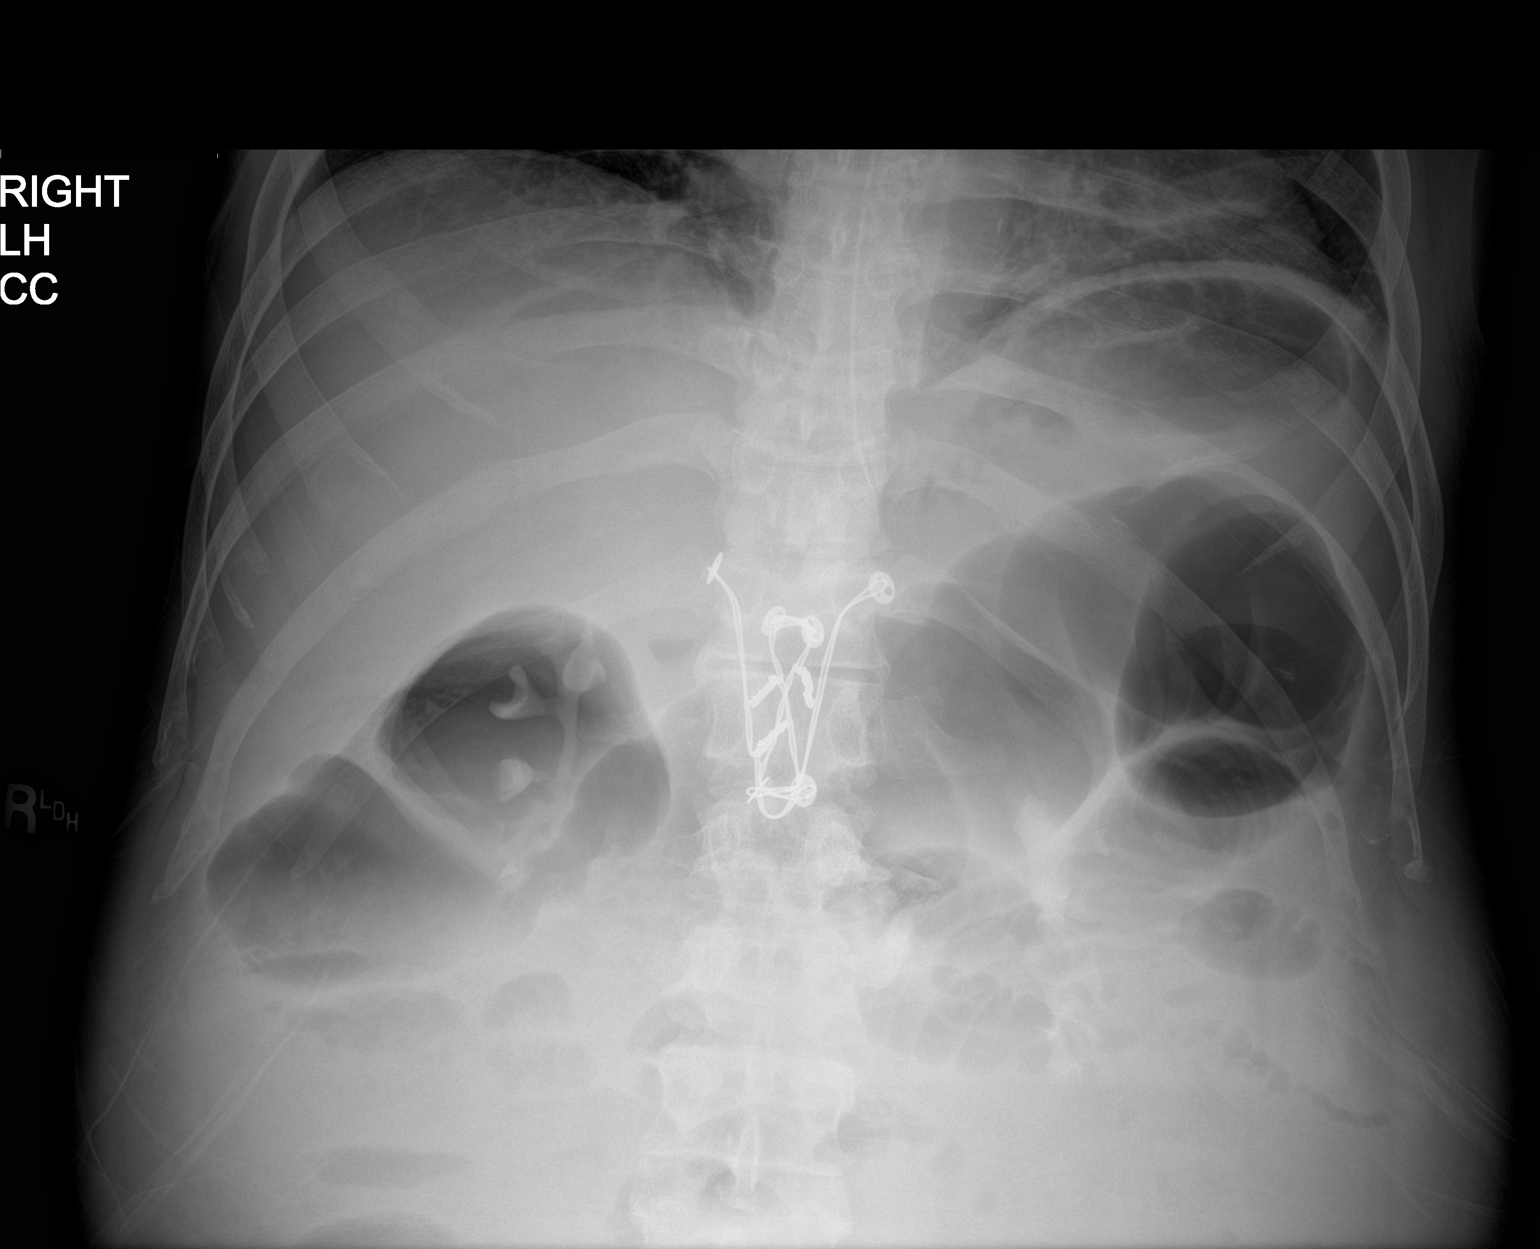

[1 of 1 positions shown; findings below may reference images not displayed]

FINDINGS: Distal tip of nasogastric tube is seen in expected position of
gastroesophageal junction. Mildly dilated small bowel loops are
noted concerning for distal small bowel obstruction. Colonic
dilatation is noted as well.
IMPRESSION: Distal tip of nasogastric tube seen in expected position of
gastroesophageal junction; advancement is recommended. Small and
large bowel dilatation is noted concerning for ileus or obstruction.

## 2020-10-28 IMAGING — CT CT ABD-PELV W/ CM
2 of 5 series · 14 of 46 positions shown, 16 images · IV contrast (omnipaque)
Comparison: CT scan 12/14/2018

CLINICAL DATA: Abdominal distension following sigmoidoscopy.

EXAM:
CT ABDOMEN AND PELVIS WITH CONTRAST
TECHNIQUE: Multidetector CT imaging of the abdomen and pelvis was performed
using the standard protocol following bolus administration of
intravenous contrast.
CONTRAST:  75mL OMNIPAQUE IOHEXOL 350 MG/ML SOLN

[Series 2: axial st · axial · 0.75mm/px · z∈[-484,-34]mm · 11 of 102 slices shown, 13 images]
[im 6/102  soft-tissue]
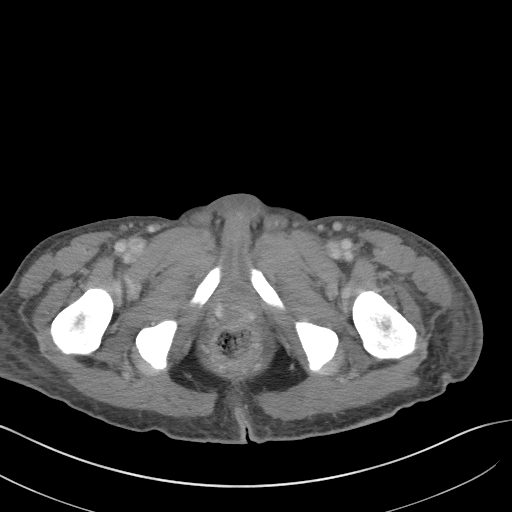
[im 6/102  bone]
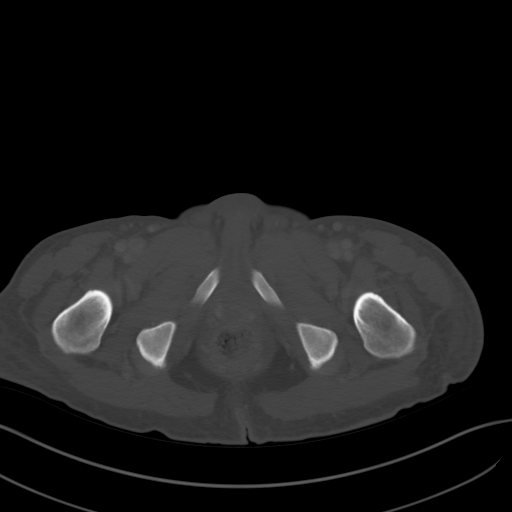
[im 16/102  soft-tissue]
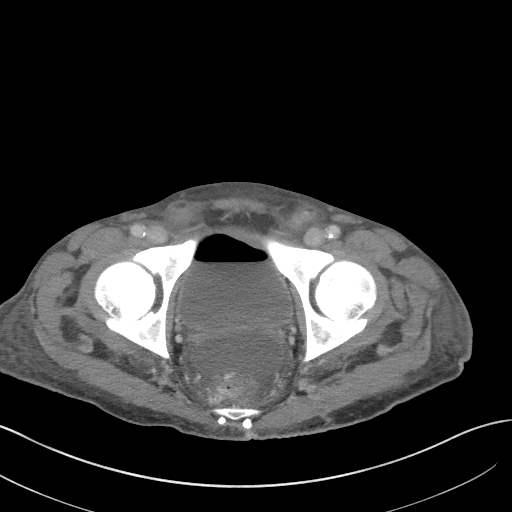
[im 27/102  soft-tissue]
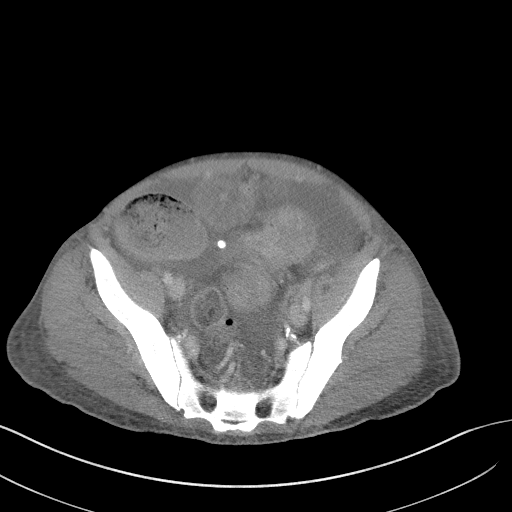
[im 32/102  soft-tissue]
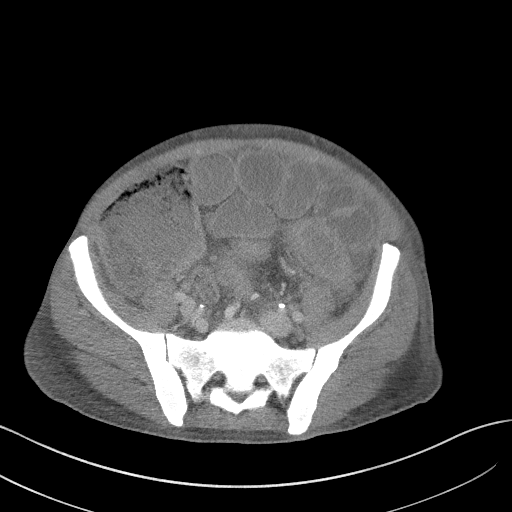
[im 43/102  soft-tissue]
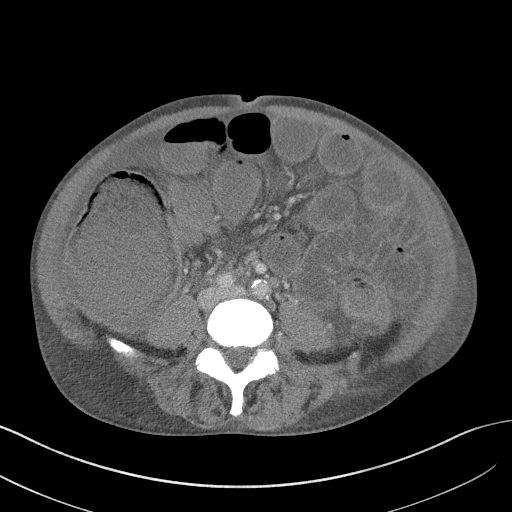
[im 54/102  soft-tissue]
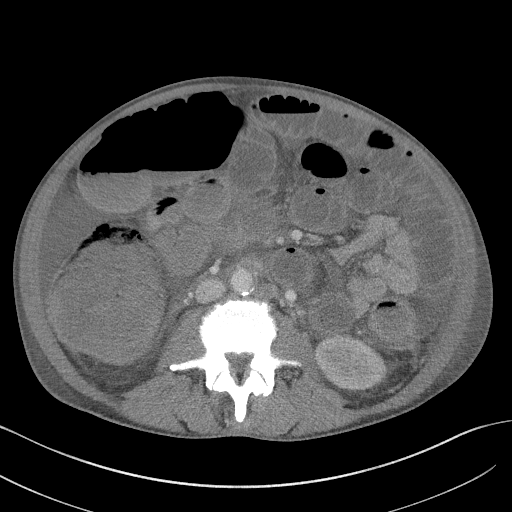
[im 59/102  soft-tissue]
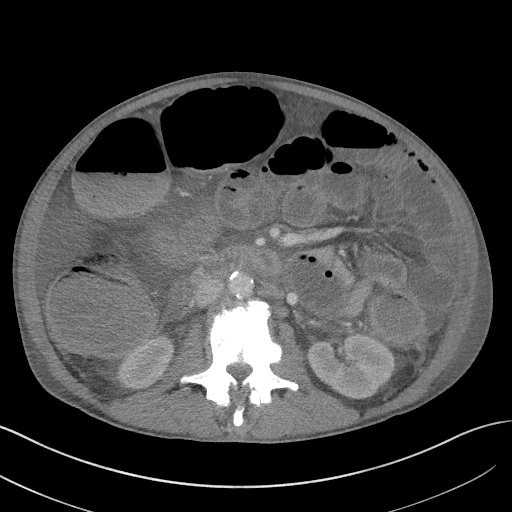
[im 70/102  soft-tissue]
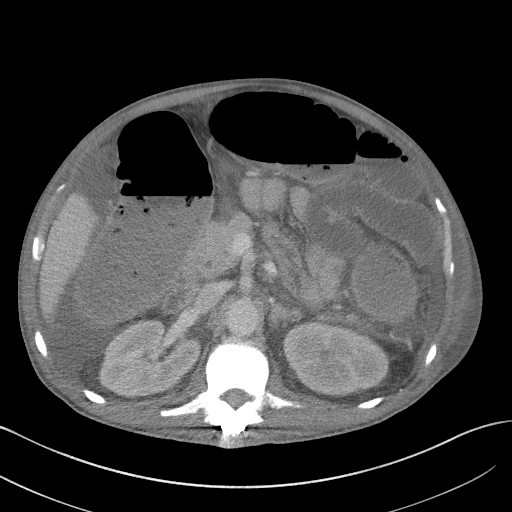
[im 75/102  soft-tissue]
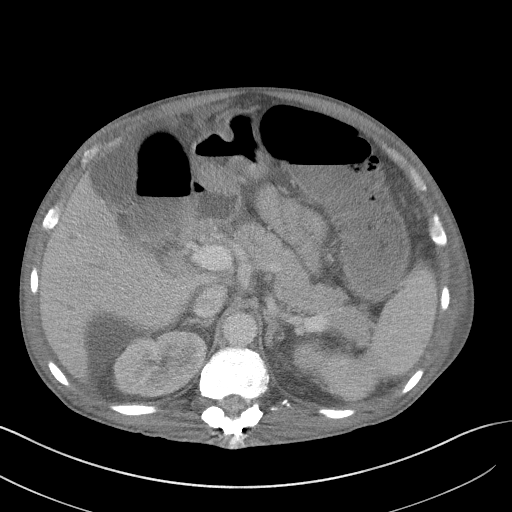
[im 75/102  bone]
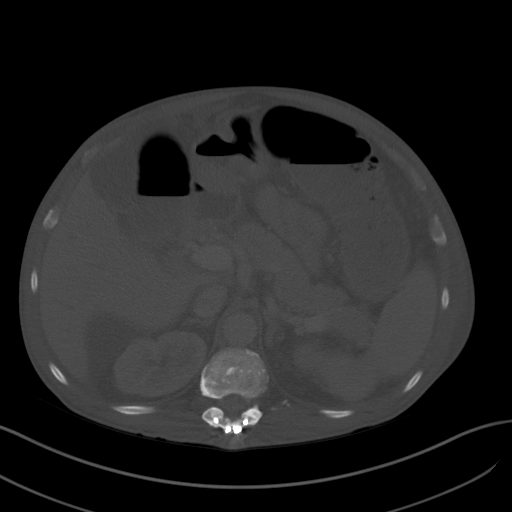
[im 86/102  soft-tissue]
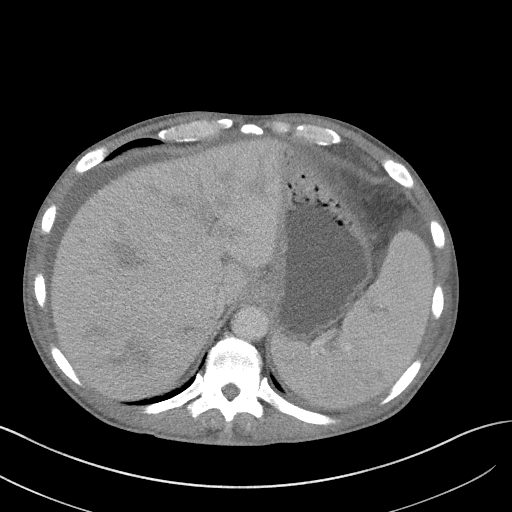
[im 96/102  soft-tissue]
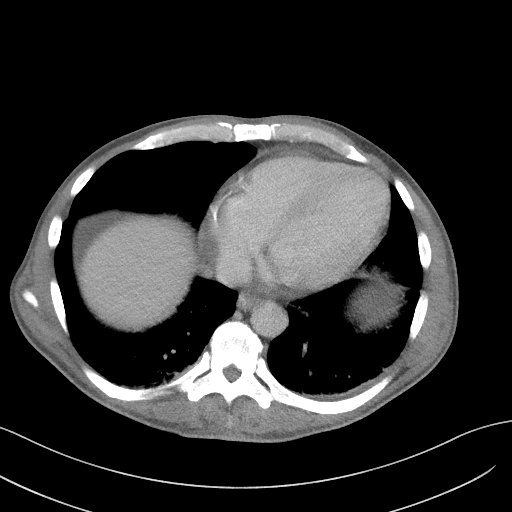

[Series 5: coronal st · coronal · 0.67mm/px · 3 of 96 slices shown]
[im 32/96  soft-tissue]
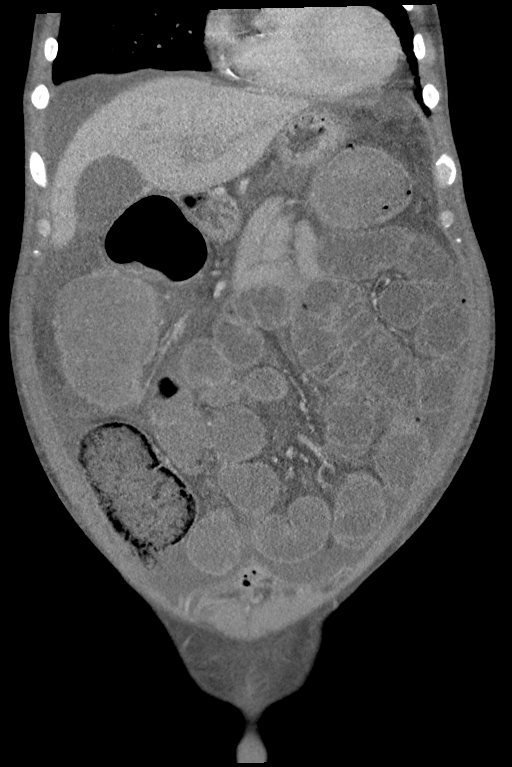
[im 43/96  soft-tissue]
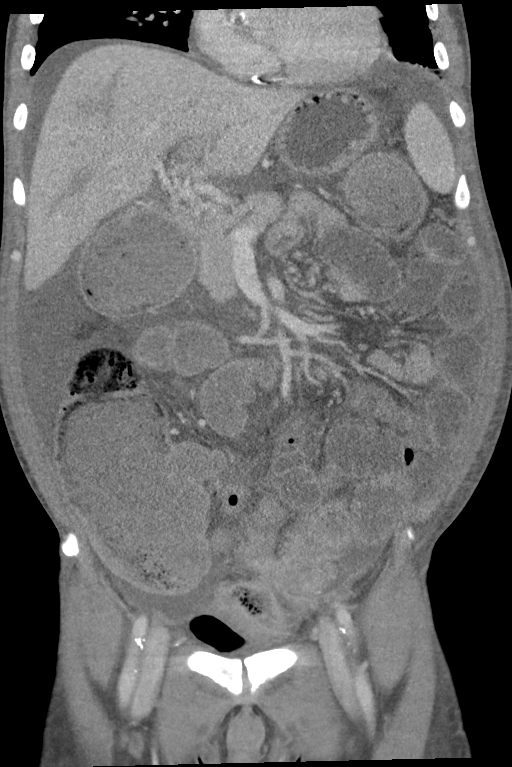
[im 53/96  soft-tissue]
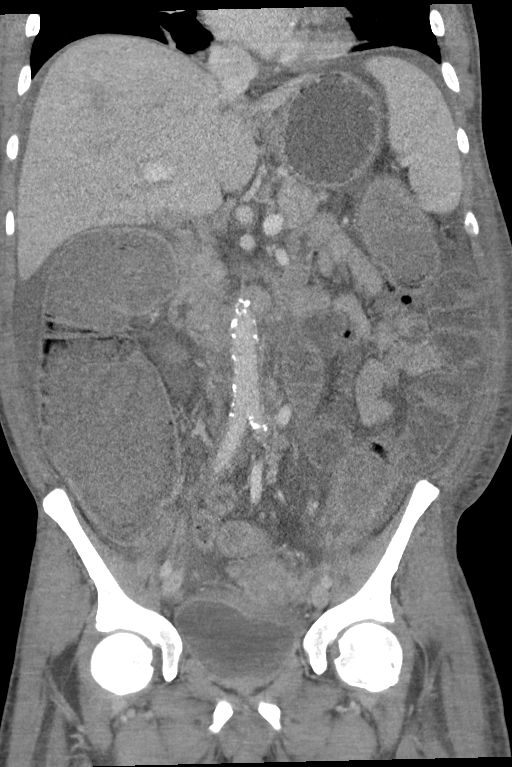

[14 of 46 positions shown; findings below may reference images not displayed]

FINDINGS: Lower chest: Streaky bibasilar atelectasis. No pleural effusions or
worrisome pulmonary lesions. The heart is normal in size. No
pericardial effusion. Three-vessel coronary artery calcifications
are noted and are quite advanced for age.

Hepatobiliary: Extensive portal vein thrombosis is again
demonstrated. The main portal vein is patent and the splenic vein is
patent. Portal venous collaterals are noted and early cavernous
transformation. No focal hepatic lesions.

Pancreas: No mass, inflammation or ductal dilatation. Small duodenal
diverticulum noted near the pancreatic head.

Spleen: Stable mild splenomegaly and small defect in the upper
anterior aspect of the spleen which could be splenic vein thrombosis
or small splenic infarct.

Adrenals/Urinary Tract: The adrenal glands and kidneys are
unremarkable and stable.

There is a large amount of air in the bladder and the dome of the
bladder is markedly thick walled and irregular. There is a
colovesical fistula. Which is coming off the sigmoid colon. Measures
approximately 4.5 x 3.7 cm.

Stomach/Bowel: The stomach is unremarkable. The duodenum appears
normal. Dilated fluid-filled loops of small bowel with air-fluid
levels and dilated colon with fluid and air-fluid levels. Findings
suggest a diffuse ileus. There is also fairly marked inflammation
and enhancement of the sigmoid colon near the colovesical fistula.
No colonic mass is identified.

Vascular/Lymphatic: Stable significant age advanced atherosclerotic
calcifications involving the aorta and iliac arteries. No aneurysm.
The branch vessels are patent. The major venous structures are
patent except for the intrahepatic portal veins.

Scattered retroperitoneal lymph nodes are again demonstrated.

Reproductive: The prostate gland and seminal vesicles are
unremarkable. Perirectal collateral vessels are noted.

Other: Moderate abdominal ascites and diffuse mesenteric edema along
with body wall edema.

Musculoskeletal: No significant bony findings.
IMPRESSION: 1. Large colovesical fistula and associated marked inflammation of
the sigmoid colon and bladder dome.
2. Distended small bowel and colon with air-fluid levels suggesting
a diffuse ileus. No findings for colonic obstruction.
3. Persistent extensive intrahepatic portal vein thrombosis.
4. Mesenteric edema, ascites and body wall edema.
5. Advanced atherosclerotic calcifications involving the aorta and
coronary arteries for age.

## 2020-10-31 IMAGING — DX DG CHEST 1V
1 series · 1 of 1 positions shown · non-contrast
Comparison: 09/06/2015.

CLINICAL DATA: Tachycardia today. Ex-smoker.

EXAM:
CHEST  1 VIEW

[chest ap]
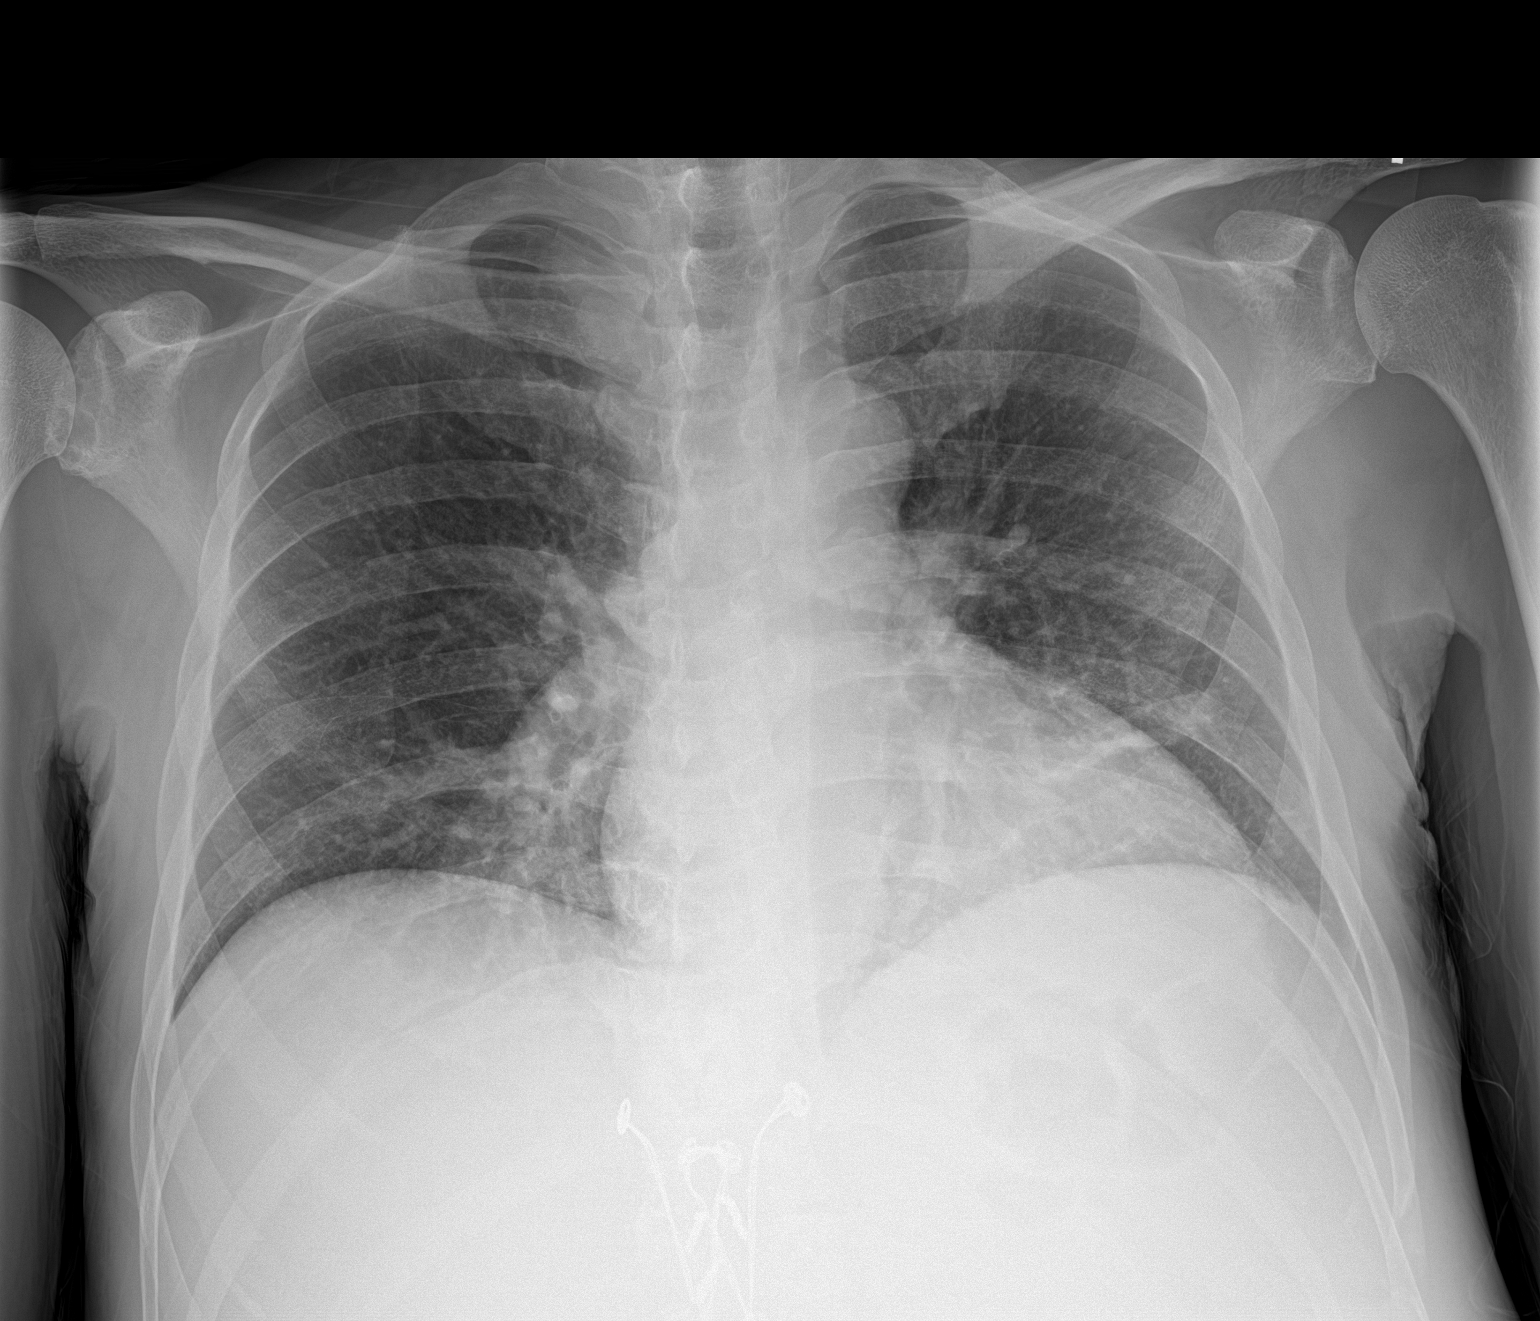

[1 of 1 positions shown; findings below may reference images not displayed]

FINDINGS: Poor inspiration. Grossly normal sized heart. Interval linear
density in the left lower lung zone. Mild diffuse peribronchial
thickening and accentuation of the interstitial markings. Stable
fixation hardware in the thoracolumbar region.
IMPRESSION: Poor inspiration with mild left basilar atelectasis and mild chronic
bronchitic changes.
